# Patient Record
Sex: Male | Born: 1945 | Race: White | Hispanic: No | Marital: Single | Smoking: Current every day smoker
Health system: Southern US, Community
[De-identification: ages and names within clinical notes are randomized; demographics above are authoritative.]

## PROBLEM LIST (undated history)

## (undated) DIAGNOSIS — I509 Heart failure, unspecified: Secondary | ICD-10-CM

## (undated) DIAGNOSIS — J439 Emphysema, unspecified: Secondary | ICD-10-CM

## (undated) DIAGNOSIS — J45909 Unspecified asthma, uncomplicated: Secondary | ICD-10-CM

## (undated) DIAGNOSIS — I10 Essential (primary) hypertension: Secondary | ICD-10-CM

## (undated) DIAGNOSIS — Z720 Tobacco use: Secondary | ICD-10-CM

## (undated) DIAGNOSIS — J189 Pneumonia, unspecified organism: Secondary | ICD-10-CM

## (undated) DIAGNOSIS — F4310 Post-traumatic stress disorder, unspecified: Secondary | ICD-10-CM

## (undated) DIAGNOSIS — B9562 Methicillin resistant Staphylococcus aureus infection as the cause of diseases classified elsewhere: Secondary | ICD-10-CM

## (undated) DIAGNOSIS — L0390 Cellulitis, unspecified: Secondary | ICD-10-CM

## (undated) DIAGNOSIS — J449 Chronic obstructive pulmonary disease, unspecified: Secondary | ICD-10-CM

## (undated) HISTORY — PX: WISDOM TOOTH EXTRACTION: SHX21

## (undated) HISTORY — PX: HERNIA REPAIR: SHX51

---

## 2008-11-21 ENCOUNTER — Emergency Department (HOSPITAL_COMMUNITY): Admission: EM | Admit: 2008-11-21 | Discharge: 2008-11-21 | Payer: Self-pay | Admitting: Emergency Medicine

## 2008-12-18 ENCOUNTER — Emergency Department (HOSPITAL_COMMUNITY): Admission: EM | Admit: 2008-12-18 | Discharge: 2008-12-18 | Payer: Self-pay | Admitting: Emergency Medicine

## 2011-02-11 LAB — DIFFERENTIAL
Basophils Relative: 0 % (ref 0–1)
Lymphs Abs: 0.4 10*3/uL — ABNORMAL LOW (ref 0.7–4.0)
Monocytes Absolute: 0 10*3/uL — ABNORMAL LOW (ref 0.1–1.0)
Monocytes Relative: 0 % — ABNORMAL LOW (ref 3–12)
Neutro Abs: 14.1 10*3/uL — ABNORMAL HIGH (ref 1.7–7.7)

## 2011-02-11 LAB — POCT I-STAT, CHEM 8
Calcium, Ion: 1.12 mmol/L (ref 1.12–1.32)
Creatinine, Ser: 1.7 mg/dL — ABNORMAL HIGH (ref 0.4–1.5)
Glucose, Bld: 127 mg/dL — ABNORMAL HIGH (ref 70–99)
Hemoglobin: 19 g/dL — ABNORMAL HIGH (ref 13.0–17.0)
Sodium: 141 mEq/L (ref 135–145)
TCO2: 28 mmol/L (ref 0–100)

## 2011-02-11 LAB — POCT CARDIAC MARKERS: Troponin i, poc: <0.05 ng/mL (ref 0.00–0.09)

## 2011-02-11 LAB — HEPATIC FUNCTION PANEL
AST: 21 U/L (ref 0–37)
Alkaline Phosphatase: 84 U/L (ref 39–117)
Bilirubin, Direct: 0.3 mg/dL (ref 0.0–0.3)
Total Bilirubin: 1.5 mg/dL — ABNORMAL HIGH (ref 0.3–1.2)

## 2011-02-11 LAB — CBC
Hemoglobin: 18.3 g/dL — ABNORMAL HIGH (ref 13.0–17.0)
MCHC: 34.5 g/dL (ref 30.0–36.0)
MCV: 85.8 fL (ref 78.0–100.0)
RBC: 6.15 MIL/uL — ABNORMAL HIGH (ref 4.22–5.81)
WBC: 14.6 10*3/uL — ABNORMAL HIGH (ref 4.0–10.5)

## 2011-02-11 LAB — LACTIC ACID, PLASMA: Lactic Acid, Venous: 1.1 mmol/L (ref 0.5–2.2)

## 2011-02-12 LAB — CBC
HCT: 40.6 % (ref 39.0–52.0)
Hemoglobin: 13.9 g/dL (ref 13.0–17.0)
MCV: 87.3 fL (ref 78.0–100.0)
RBC: 4.65 MIL/uL (ref 4.22–5.81)
WBC: 10.4 10*3/uL (ref 4.0–10.5)

## 2011-02-12 LAB — BASIC METABOLIC PANEL
Chloride: 99 mEq/L (ref 96–112)
GFR calc Af Amer: >60 mL/min (ref >60)
GFR calc non Af Amer: >60 mL/min (ref >60)
Potassium: 3.6 mEq/L (ref 3.5–5.1)
Sodium: 136 mEq/L (ref 135–145)

## 2011-02-12 LAB — DIFFERENTIAL
Eosinophils Absolute: 0.1 10*3/uL (ref 0.0–0.7)
Eosinophils Relative: 1 % (ref 0–5)
Lymphocytes Relative: 13 % (ref 12–46)
Lymphs Abs: 1.4 10*3/uL (ref 0.7–4.0)
Monocytes Relative: 5 % (ref 3–12)

## 2012-12-01 ENCOUNTER — Encounter (HOSPITAL_COMMUNITY): Payer: Self-pay | Admitting: Emergency Medicine

## 2012-12-01 ENCOUNTER — Emergency Department (HOSPITAL_COMMUNITY): Payer: Medicare Other

## 2012-12-01 ENCOUNTER — Inpatient Hospital Stay (HOSPITAL_COMMUNITY)
Admission: EM | Admit: 2012-12-01 | Discharge: 2012-12-06 | DRG: 190 | Disposition: A | Discharge status: Home / Self Care | Payer: Medicare Other | Attending: Internal Medicine | Admitting: Internal Medicine

## 2012-12-01 DIAGNOSIS — Z79899 Other long term (current) drug therapy: Secondary | ICD-10-CM

## 2012-12-01 DIAGNOSIS — F4310 Post-traumatic stress disorder, unspecified: Secondary | ICD-10-CM

## 2012-12-01 DIAGNOSIS — R7303 Prediabetes: Secondary | ICD-10-CM

## 2012-12-01 DIAGNOSIS — J9601 Acute respiratory failure with hypoxia: Secondary | ICD-10-CM

## 2012-12-01 DIAGNOSIS — R000 Tachycardia, unspecified: Secondary | ICD-10-CM

## 2012-12-01 DIAGNOSIS — J101 Influenza due to other identified influenza virus with other respiratory manifestations: Secondary | ICD-10-CM | POA: Diagnosis present

## 2012-12-01 DIAGNOSIS — F17200 Nicotine dependence, unspecified, uncomplicated: Secondary | ICD-10-CM | POA: Diagnosis present

## 2012-12-01 DIAGNOSIS — J441 Chronic obstructive pulmonary disease with (acute) exacerbation: Principal | ICD-10-CM

## 2012-12-01 DIAGNOSIS — D696 Thrombocytopenia, unspecified: Secondary | ICD-10-CM

## 2012-12-01 DIAGNOSIS — J9600 Acute respiratory failure, unspecified whether with hypoxia or hypercapnia: Secondary | ICD-10-CM

## 2012-12-01 DIAGNOSIS — J449 Chronic obstructive pulmonary disease, unspecified: Secondary | ICD-10-CM | POA: Diagnosis present

## 2012-12-01 DIAGNOSIS — I498 Other specified cardiac arrhythmias: Secondary | ICD-10-CM | POA: Diagnosis present

## 2012-12-01 DIAGNOSIS — Z720 Tobacco use: Secondary | ICD-10-CM

## 2012-12-01 DIAGNOSIS — R7309 Other abnormal glucose: Secondary | ICD-10-CM

## 2012-12-01 DIAGNOSIS — J4489 Other specified chronic obstructive pulmonary disease: Secondary | ICD-10-CM

## 2012-12-01 HISTORY — DX: Unspecified asthma, uncomplicated: J45.909

## 2012-12-01 HISTORY — DX: Tobacco use: Z72.0

## 2012-12-01 HISTORY — DX: Chronic obstructive pulmonary disease, unspecified: J44.9

## 2012-12-01 HISTORY — DX: Emphysema, unspecified: J43.9

## 2012-12-01 HISTORY — DX: Post-traumatic stress disorder, unspecified: F43.10

## 2012-12-01 LAB — COMPREHENSIVE METABOLIC PANEL
ALT: 16 U/L (ref 0–53)
Alkaline Phosphatase: 70 U/L (ref 39–117)
BUN: 8 mg/dL (ref 6–23)
CO2: 27 mEq/L (ref 19–32)
Calcium: 8.6 mg/dL (ref 8.4–10.5)
GFR calc Af Amer: >90 mL/min (ref >90)
GFR calc non Af Amer: 86 mL/min — ABNORMAL LOW (ref >90)
Glucose, Bld: 108 mg/dL — ABNORMAL HIGH (ref 70–99)
Sodium: 136 mEq/L (ref 135–145)
Total Protein: 7.7 g/dL (ref 6.0–8.3)

## 2012-12-01 LAB — CBC WITH DIFFERENTIAL/PLATELET
Eosinophils Absolute: 0 10*3/uL (ref 0.0–0.7)
Eosinophils Relative: 0 % (ref 0–5)
HCT: 41.5 % (ref 39.0–52.0)
Hemoglobin: 13.9 g/dL (ref 13.0–17.0)
Lymphocytes Relative: 3 % — ABNORMAL LOW (ref 12–46)
Lymphs Abs: 0.3 10*3/uL — ABNORMAL LOW (ref 0.7–4.0)
MCH: 30.3 pg (ref 26.0–34.0)
MCV: 90.4 fL (ref 78.0–100.0)
Monocytes Absolute: 0.1 10*3/uL (ref 0.1–1.0)
Monocytes Relative: 1 % — ABNORMAL LOW (ref 3–12)
Platelets: 126 10*3/uL — ABNORMAL LOW (ref 150–400)
RBC: 4.59 MIL/uL (ref 4.22–5.81)
WBC: 10.4 10*3/uL (ref 4.0–10.5)

## 2012-12-01 LAB — BLOOD GAS, ARTERIAL
Acid-Base Excess: 2.6 mmol/L — ABNORMAL HIGH (ref 0.0–2.0)
Bicarbonate: 27.1 mEq/L — ABNORMAL HIGH (ref 20.0–24.0)
O2 Saturation: 97.3 %
Patient temperature: 100
pO2, Arterial: 86.9 mmHg (ref 80.0–100.0)

## 2012-12-01 LAB — PRO B NATRIURETIC PEPTIDE: Pro B Natriuretic peptide (BNP): 456.5 pg/mL — ABNORMAL HIGH (ref 0–125)

## 2012-12-01 LAB — GLUCOSE, CAPILLARY: Glucose-Capillary: 168 mg/dL — ABNORMAL HIGH (ref 70–99)

## 2012-12-01 MED ORDER — INSULIN ASPART 100 UNIT/ML ~~LOC~~ SOLN
0.0000 [IU] | Freq: Three times a day (TID) | SUBCUTANEOUS | Status: DC
  Administered 2012-12-02 (×2): 1 [IU] via SUBCUTANEOUS
  Administered 2012-12-02 – 2012-12-03 (×2): 2 [IU] via SUBCUTANEOUS
  Administered 2012-12-03 (×2): 1 [IU] via SUBCUTANEOUS
  Administered 2012-12-04: 3 [IU] via SUBCUTANEOUS
  Administered 2012-12-04: 1 [IU] via SUBCUTANEOUS
  Administered 2012-12-05: 2 [IU] via SUBCUTANEOUS

## 2012-12-01 MED ORDER — FLUTICASONE PROPIONATE 50 MCG/ACT NA SUSP
2.0000 | Freq: Every day | NASAL | Status: DC
  Administered 2012-12-02 – 2012-12-06 (×5): 2 via NASAL
  Filled 2012-12-01: qty 16

## 2012-12-01 MED ORDER — ACETAMINOPHEN 325 MG PO TABS: 650.0000 mg | ORAL_TABLET | Freq: Four times a day (QID) | ORAL | Status: DC | PRN

## 2012-12-01 MED ORDER — BIOTENE DRY MOUTH MT LIQD
15.0000 mL | Freq: Two times a day (BID) | OROMUCOSAL | Status: DC
  Administered 2012-12-02 – 2012-12-05 (×6): 15 mL via OROMUCOSAL

## 2012-12-01 MED ORDER — OXYMETAZOLINE HCL 0.05 % NA SOLN
1.0000 | Freq: Two times a day (BID) | NASAL | Status: AC
  Administered 2012-12-02 – 2012-12-03 (×2): 1 via NASAL
  Filled 2012-12-01: qty 15

## 2012-12-01 MED ORDER — DOCUSATE SODIUM 100 MG PO CAPS
100.0000 mg | ORAL_CAPSULE | Freq: Two times a day (BID) | ORAL | Status: DC
  Administered 2012-12-02 – 2012-12-06 (×7): 100 mg via ORAL
  Filled 2012-12-01 (×11): qty 1

## 2012-12-01 MED ORDER — SENNA 8.6 MG PO TABS
1.0000 | ORAL_TABLET | Freq: Two times a day (BID) | ORAL | Status: DC
  Administered 2012-12-02 – 2012-12-04 (×4): 8.6 mg via ORAL
  Filled 2012-12-01 (×7): qty 1

## 2012-12-01 MED ORDER — BUDESONIDE 0.5 MG/2ML IN SUSP
0.5000 mg | Freq: Two times a day (BID) | RESPIRATORY_TRACT | Status: DC
  Administered 2012-12-01 – 2012-12-05 (×9): 0.5 mg via RESPIRATORY_TRACT
  Filled 2012-12-01 (×13): qty 2

## 2012-12-01 MED ORDER — METHYLPREDNISOLONE SODIUM SUCC 125 MG IJ SOLR
60.0000 mg | Freq: Two times a day (BID) | INTRAMUSCULAR | Status: DC
  Administered 2012-12-01 – 2012-12-04 (×6): 60 mg via INTRAVENOUS
  Filled 2012-12-01 (×8): qty 0.96

## 2012-12-01 MED ORDER — CEPASTAT 14.5 MG MT LOZG
1.0000 | LOZENGE | OROMUCOSAL | Status: DC | PRN
  Administered 2012-12-01: 1 via BUCCAL
  Filled 2012-12-01 (×4): qty 18

## 2012-12-01 MED ORDER — POLYETHYLENE GLYCOL 3350 17 G PO PACK
17.0000 g | PACK | Freq: Every day | ORAL | Status: DC | PRN
  Filled 2012-12-01: qty 1

## 2012-12-01 MED ORDER — ZOLPIDEM TARTRATE 10 MG PO TABS
10.0000 mg | ORAL_TABLET | Freq: Every day | ORAL | Status: DC
  Administered 2012-12-01 – 2012-12-05 (×5): 10 mg via ORAL
  Filled 2012-12-01 (×5): qty 1

## 2012-12-01 MED ORDER — BIOTENE DRY MOUTH MT LIQD: 15.0000 mL | OROMUCOSAL | Status: DC | PRN

## 2012-12-01 MED ORDER — SODIUM CHLORIDE 0.9 % IJ SOLN
3.0000 mL | Freq: Two times a day (BID) | INTRAMUSCULAR | Status: DC
  Administered 2012-12-01 – 2012-12-06 (×4): 3 mL via INTRAVENOUS

## 2012-12-01 MED ORDER — ACETAMINOPHEN 650 MG RE SUPP: 650.0000 mg | Freq: Four times a day (QID) | RECTAL | Status: DC | PRN

## 2012-12-01 MED ORDER — IPRATROPIUM BROMIDE 0.02 % IN SOLN
0.5000 mg | RESPIRATORY_TRACT | Status: DC
  Administered 2012-12-01 – 2012-12-05 (×23): 0.5 mg via RESPIRATORY_TRACT
  Filled 2012-12-01 (×24): qty 2.5

## 2012-12-01 MED ORDER — LEVOFLOXACIN IN D5W 750 MG/150ML IV SOLN
750.0000 mg | Freq: Once | INTRAVENOUS | Status: AC
  Administered 2012-12-01: 750 mg via INTRAVENOUS
  Filled 2012-12-01: qty 150

## 2012-12-01 MED ORDER — BENZONATATE 100 MG PO CAPS
200.0000 mg | ORAL_CAPSULE | Freq: Three times a day (TID) | ORAL | Status: DC
Start: 2012-12-01 — End: 2012-12-06
  Administered 2012-12-01 – 2012-12-06 (×14): 200 mg via ORAL
  Filled 2012-12-01 (×16): qty 2

## 2012-12-01 MED ORDER — ALBUTEROL (5 MG/ML) CONTINUOUS INHALATION SOLN
10.0000 mg/h | INHALATION_SOLUTION | Freq: Once | RESPIRATORY_TRACT | Status: AC
  Administered 2012-12-01: 10 mg/h via RESPIRATORY_TRACT
  Filled 2012-12-01: qty 20

## 2012-12-01 MED ORDER — ALBUTEROL SULFATE (5 MG/ML) 0.5% IN NEBU
2.5000 mg | INHALATION_SOLUTION | RESPIRATORY_TRACT | Status: DC
  Administered 2012-12-01 – 2012-12-05 (×23): 2.5 mg via RESPIRATORY_TRACT
  Filled 2012-12-01 (×25): qty 0.5

## 2012-12-01 MED ORDER — SODIUM CHLORIDE 0.9 % IV SOLN
INTRAVENOUS | Status: DC
  Administered 2012-12-01: 75 mL/h via INTRAVENOUS

## 2012-12-01 MED ORDER — PANTOPRAZOLE SODIUM 40 MG PO TBEC
40.0000 mg | DELAYED_RELEASE_TABLET | Freq: Two times a day (BID) | ORAL | Status: DC
  Administered 2012-12-02 – 2012-12-06 (×9): 40 mg via ORAL
  Filled 2012-12-01 (×11): qty 1

## 2012-12-01 MED ORDER — DOXEPIN HCL 50 MG PO CAPS
50.0000 mg | ORAL_CAPSULE | Freq: Every day | ORAL | Status: DC
  Administered 2012-12-01 – 2012-12-05 (×5): 50 mg via ORAL
  Filled 2012-12-01 (×6): qty 1

## 2012-12-01 MED ORDER — ONDANSETRON HCL 4 MG/2ML IJ SOLN: 4.0000 mg | Freq: Four times a day (QID) | INTRAMUSCULAR | Status: DC | PRN

## 2012-12-01 MED ORDER — GUAIFENESIN-DM 100-10 MG/5ML PO SYRP: 5.0000 mL | ORAL_SOLUTION | ORAL | Status: DC | PRN

## 2012-12-01 MED ORDER — ENOXAPARIN SODIUM 40 MG/0.4ML ~~LOC~~ SOLN
40.0000 mg | SUBCUTANEOUS | Status: DC
  Administered 2012-12-01 – 2012-12-05 (×5): 40 mg via SUBCUTANEOUS
  Filled 2012-12-01 (×6): qty 0.4

## 2012-12-01 MED ORDER — SALINE SPRAY 0.65 % NA SOLN
1.0000 | NASAL | Status: DC | PRN
  Administered 2012-12-01 – 2012-12-02 (×2): 1 via NASAL
  Filled 2012-12-01: qty 44

## 2012-12-01 MED ORDER — LEVOFLOXACIN IN D5W 750 MG/150ML IV SOLN
750.0000 mg | INTRAVENOUS | Status: DC
  Administered 2012-12-02 – 2012-12-03 (×2): 750 mg via INTRAVENOUS
  Filled 2012-12-01 (×3): qty 150

## 2012-12-01 MED ORDER — ALBUTEROL SULFATE (5 MG/ML) 0.5% IN NEBU
2.5000 mg | INHALATION_SOLUTION | RESPIRATORY_TRACT | Status: DC | PRN
  Administered 2012-12-05: 2.5 mg via RESPIRATORY_TRACT
  Filled 2012-12-01: qty 0.5

## 2012-12-01 MED ORDER — CHLORHEXIDINE GLUCONATE 0.12 % MT SOLN
15.0000 mL | Freq: Two times a day (BID) | OROMUCOSAL | Status: DC
  Administered 2012-12-02 – 2012-12-06 (×8): 15 mL via OROMUCOSAL
  Filled 2012-12-01 (×11): qty 15

## 2012-12-01 MED ORDER — SERTRALINE HCL 100 MG PO TABS
100.0000 mg | ORAL_TABLET | Freq: Every day | ORAL | Status: DC
  Administered 2012-12-01 – 2012-12-06 (×6): 100 mg via ORAL
  Filled 2012-12-01 (×6): qty 1

## 2012-12-01 MED ORDER — IBUPROFEN 200 MG PO TABS
600.0000 mg | ORAL_TABLET | Freq: Once | ORAL | Status: AC
  Administered 2012-12-01: 600 mg via ORAL
  Filled 2012-12-01: qty 3

## 2012-12-01 MED ORDER — ONDANSETRON HCL 4 MG PO TABS: 4.0000 mg | ORAL_TABLET | Freq: Four times a day (QID) | ORAL | Status: DC | PRN

## 2012-12-01 NOTE — ED Notes (Signed)
Pt called his nephew to see if he could come get pt's wallet with $1000 in it, so it doesn't have to be inventoried and locked up with security.

## 2012-12-01 NOTE — ED Notes (Signed)
Per EMS pt c/o shob and resp distress times 2-3 days even after home neb treatments..  10mg  albuterol and 0.5mg  atrovent, 125mg  solu-medrol in route by EMS.

## 2012-12-01 NOTE — ED Notes (Signed)
MD hospitalist at bedside.

## 2012-12-01 NOTE — Progress Notes (Signed)
ANTIBIOTIC CONSULT NOTE - INITIAL  Pharmacy Consult for Levaquin  Indication: COPD exacerbation   No Known Allergies  Patient Measurements:   Vital Signs: Temp: 99.1 F (37.3 C) (02/04 2015) Temp src: Oral (02/04 2015) BP: 131/78 mmHg (02/04 2020) Pulse Rate: 96  (02/04 2020) Intake/Output from previous day:   Intake/Output from this shift:    Labs:  Basename 12/01/12 1630 12/01/12 1550  WBC 10.4 --  HGB 13.9 --  PLT 126* --  LABCREA -- --  CREATININE -- 0.91   CrCl is unknown because there is no height on file for the current visit. No results found for this basename: VANCOTROUGH:2,VANCOPEAK:2,VANCORANDOM:2,GENTTROUGH:2,GENTPEAK:2,GENTRANDOM:2,TOBRATROUGH:2,TOBRAPEAK:2,TOBRARND:2,AMIKACINPEAK:2,AMIKACINTROU:2,AMIKACIN:2, in the last 72 hours   Microbiology: No results found for this or any previous visit (from the past 720 hour(s)).  Medical History: Past Medical History  Diagnosis Date  . COPD (chronic obstructive pulmonary disease)   . Asthma   . Emphysema   . PTSD (post-traumatic stress disorder)   . Tobacco abuse     Medications:  Scheduled:    . ipratropium  0.5 mg Nebulization Q4H   And  . albuterol  2.5 mg Nebulization Q4H  . [COMPLETED] albuterol  10 mg/hr Nebulization Once  . benzonatate  200 mg Oral TID  . budesonide (PULMICORT) nebulizer solution  0.5 mg Nebulization BID  . docusate sodium  100 mg Oral BID  . doxepin  50 mg Oral QHS  . enoxaparin (LOVENOX) injection  40 mg Subcutaneous Q24H  . fluticasone  2 spray Each Nare Daily  . [COMPLETED] ibuprofen  600 mg Oral Once  . insulin aspart  0-9 Units Subcutaneous TID WC  . methylPREDNISolone (SOLU-MEDROL) injection  60 mg Intravenous Q12H  . oxymetazoline  1 spray Each Nare BID  . pantoprazole  40 mg Oral BID AC  . senna  1 tablet Oral BID  . sertraline  100 mg Oral Daily  . sodium chloride  3 mL Intravenous Q12H  . zolpidem  10 mg Oral QHS   Infusions:    . sodium chloride    .  [COMPLETED] levofloxacin (LEVAQUIN) IV Stopped (12/01/12 1808)  . levofloxacin (LEVAQUIN) IV     PRN: acetaminophen, acetaminophen, albuterol, antiseptic oral rinse, guaiFENesin-dextromethorphan, ondansetron (ZOFRAN) IV, ondansetron, phenol-menthol, polyethylene glycol, sodium chloride Anti-infectives     Start     Dose/Rate Route Frequency Ordered Stop   12/02/12 1600   levofloxacin (LEVAQUIN) IVPB 750 mg        750 mg 100 mL/hr over 90 Minutes Intravenous Every 24 hours 12/01/12 2052     12/01/12 1615   levofloxacin (LEVAQUIN) IVPB 750 mg        750 mg 100 mL/hr over 90 Minutes Intravenous  Once 12/01/12 1603 12/01/12 1808         Assessment:  67 yo M starting Levaquin for COPD exacerbation  Scr WNL  Will f/u with updated ht/wt when available   Goal of Therapy:  Levaquin per renal function   Plan:  1.) Levaquin 750 mg IV q24h, next dose due 2/5 as dose was given in ER today.  2.) f/u Ht/Wt/Renal function   Georg Ang, Loma Messing 12/01/2012,8:53 PM

## 2012-12-01 NOTE — ED Notes (Signed)
Inquired to pt ab money in his wallet requesting to let us lock it up for him. Explained our policy to pt. Pt refused having money locked up. Pt admits to having less than $1000 in wallet to "about $500".

## 2012-12-01 NOTE — H&P (Addendum)
[facility] History and Physical  Simone Tuckey XBJ:[id number] DOB: October 29, 1945 DOA: 12/01/2012  Referring physician: Dr. Ranae Palms PCP: No primary provider on file.  Dr. Manfred Arch at the [facility]  Chief Complaint:  SOB  HPI:  The patient is a 67 yo male with history of COPD and PTSD who presents with SOB.   A month ago, he was able to walk a [place] distance to his horse barn, a week ago, he could barely walk around the house, and for the last two days he has been SOB at rest.  He called EMS today because he could barely breath.  He has had a dry wheezy cough for the last two days and some subjective fevers.  Denies rhinorrhea, sinus congestion.  He has a dry throat.  Denies hemoptysis.  In transport, he was given solumedrol 125mg , albuterol 10mg  and atrovent 5mg .  He normally has the ambo take him to the [place], however, today, he had them take him to the nearest ER because "I thought I was going to die."  In the ER, his labs were notable for platelets of 126 and CXR was c/w emphysema.  He was started on bipap for tachypnea and moderate to severe respiratory distress which remained on for about an hour.  He was transitioned to 2L.  ABG on 2L [place] was pH 7.4, pCO2 45, pO2 87.  He was given albuterol 10mg  and levofloxacin IV.    Review of Systems:   Endorses subjective fevers without chills.  Denies changes to hearing and vision.  Denies rhinorrhea, sinus congestion.  Has very dry throat but denies sore throat.  Chest tightness is better after albuterol treatments.  SOB, wheezing, and cough as per HPI.  Denies nausea, vomiting, diarrhea, constipation, blood in stools, dysuria, difficulty urinating, hematuria, polyuria, polydipsia, lymphadenopathy.  He has skin ulcers on his arms.  Denies abnormal bruising or bleeding, focal weakness or numbness.  He has PTSD.    Past Medical History  Diagnosis Date  . COPD (chronic obstructive pulmonary disease)   . Asthma   . Emphysema   . PTSD (post-traumatic  stress disorder)   . Tobacco abuse    Past Surgical History  Procedure Date  . Hernia repair    Social History:  reports that he has been smoking Cigarettes.  He has a 110 pack-year smoking history. He has never used smokeless tobacco. He reports that he drinks alcohol. He reports that he does not use illicit drugs. [place]  No Known Allergies  Family History  Problem Relation Age of Onset  . Asthma Cousin   . Asthma Cousin   . Diabetes Brother   . Diabetes Mother     Prior to Admission medications   Medication Sig Start Date End Date Taking? Authorizing Provider  albuterol (PROVENTIL HFA;VENTOLIN HFA) 108 (90 BASE) MCG/ACT inhaler Inhale 2 puffs into the lungs every 6 (six) hours as needed.   Yes Historical Provider, MD  budesonide-formoterol (SYMBICORT) 160-4.5 MCG/ACT inhaler Inhale 2 puffs into the lungs 2 (two) times daily.   Yes Historical Provider, MD  doxepin (SINEQUAN) 50 MG capsule Take 50 mg by mouth at bedtime.   Yes Historical Provider, MD  sertraline (ZOLOFT) 100 MG tablet Take 100 mg by mouth daily.   Yes Historical Provider, MD  zolpidem (AMBIEN) 10 MG tablet Take 10 mg by mouth at bedtime.   Yes Historical Provider, MD   Physical Exam: Filed Vitals:   12/01/12 1704 12/01/12 1800  12/01/12 1830 12/01/12 1845  BP:  130/75    Pulse:   105 103  Resp:  22 13 18   SpO2: 96%  95% 95%     General:  Obese caucasian male, moderate respiratory distress with SCM, supraclavicular, suprasternal and subcostal retractions, prolonged and forced expiratory phase and pursed lip breathing  Eyes: PERRL, mild injection, anicteric  ENT: nares clear, OP nonerythematous, no exudate or plaques, mildly dry MM  Neck: No thyromegaly or nodules, supple  Lymph:  No cervical or supraclavicular LAD  Cardiovascular: Tachycardic, regular rhythm, no murmurs, rubs, or gallops, 2+ pulses, warm extremities  Respiratory:  Faint rales at the bilateral bases R>L, diminished  at bilateral bases, full high pitched expiratory wheeze throughout, no rhonchi.  Frequent dry wheezy cough.   Abdomen: NABS, soft, mildly distended with mild diffuse tenderness without rebound or guarding   Skin: several ulcers on the forearms with rolled edges  Musculoskeletal: Normal tone and bulk, trace LEE   Psychiatric: A&OX4, normal affect  Neurologic: III-XII grossly intact, strength 5/5 throughout, sensation intact to light touch.  No dysmetria  Labs on Admission:  Basic Metabolic Panel:  Lab 12/01/12 1610  NA 136  K 4.0  CL 96  CO2 27  GLUCOSE 108*  BUN 8  CREATININE 0.91  CALCIUM 8.6  MG --  PHOS --   Liver Function Tests:  Lab 12/01/12 1550  AST 28  ALT 16  ALKPHOS 70  BILITOT 0.8  PROT 7.7  ALBUMIN 3.7   No results found for this basename: LIPASE:5,AMYLASE:5 in the last 168 hours No results found for this basename: AMMONIA:5 in the last 168 hours CBC:  Lab 12/01/12 1630  WBC 10.4  NEUTROABS 9.9*  HGB 13.9  HCT 41.5  MCV 90.4  PLT 126*   Cardiac Enzymes:  Lab 12/01/12 1550  CKTOTAL --  CKMB --  CKMBINDEX --  TROPONINI <0.30    BNP (last 3 results)  Basename 12/01/12 1550  PROBNP 456.5*   CBG: No results found for this basename: GLUCAP:5 in the last 168 hours  Radiological Exams on Admission: Dg Chest Port 1 View  12/01/2012  *RADIOLOGY REPORT*  Clinical Data: Shortness of breath.  COPD.  PORTABLE CHEST - 1 VIEW  Comparison: PA and lateral chest 12/18/2008.  Findings: The chest is hyperexpanded but the lungs are clear. Heart size is normal.  No pneumothorax or pleural fluid. Surgical clips at the gastroesophageal junction are again seen.  IMPRESSION: Hyperexpansion compatible with emphysema.  No acute abnormality.   Original Report Authenticated By: Holley Dexter, M.D.     EKG: Independently reviewed. Sinus tachycardia  Assessment/Plan Active Problems:  COPD (chronic obstructive pulmonary disease)  PTSD (post-traumatic stress  disorder)  Acute respiratory failure with hypoxia  Tobacco abuse  Thrombocytopenia  Sinus tachycardia   Acute hypoxic respiratory failure due to acute COPD exacerbation.  Trigger may have been a viral illness, including flu, particularly given patient's recent subjective fevers.  Tachypneic to the high 20s and low 30s during exam with mod. resp distress. -  Admit to stepdown -  Flu swab -  Nasal canula to keep O2 sat > 88 % -  Solumedrol 80mg  bid  -  Budesonide -  Duonebs q4h -  Albuterol q2h prn -  Levofloxacin -  Tessalon  -  Mucinex -  Nasal saline, flonase, afrin -  Protonix bid -  Hold advair and symbicort  Borderline diabetes and Hyperglycemia risk while on steroids -  SSI -  A1c  Sinus tachycardia, likely due to albuterol and respiratory distress, but patient also appears dry.  Improved after albuterol and bipap.   -  IVF -  Continue breathing treatments as abov  Thrombocytopenia, mild and asymptomatic.  Unclear etiology as this time. -  Trend platelets  Tobacco abuse:  Encouraged cessation.  Patient states his PCP gave him an Rx for chantix last week.   -  Declined gum and patch for now  PTSD:   -  Patient's family member to bring in medications to verify as he is unsure of doses.    DIET:  Regular ACCESS:  PIV IVF:  NS at 32ml/h Proph:  lovenox  Code Status: full code Family Communication: spoke with patient alone Disposition Plan: admit to stepdown  Time spent: 34  Renae Fickle [facility] Pager [phone]  If 7PM-7AM, please contact night-coverage [email or web address] Password [facility] 12/01/2012, 7:19 PM

## 2012-12-01 NOTE — ED Provider Notes (Signed)
History  This chart was scribed for [name] Racer, MD by Shari Heritage, ED Scribe. The patient was seen in room RESB/RESB. Patient's care was started at 1532.   CSN: [id number]  Arrival date & time 12/01/12  1517   First MD Initiated Contact with Patient 12/01/12 1532      Chief Complaint  Patient presents with  . Respiratory Distress    Patient is a 67 y.o. male presenting with shortness of breath. The history is provided by the patient. No language interpreter was used.  Shortness of Breath  The current episode started 2 days ago. The onset was gradual. The problem occurs continuously. The problem has been unchanged. The problem is severe. Associated symptoms include a fever, cough, shortness of breath and wheezing. His past medical history is significant for asthma and past wheezing.     HPI Comments: [name] [name] is a 67 y.o. male with history of COPD and asthma who presents to the Emergency Department complaining of increased, persistent, moderate to severe shortness of breath and wheezing onset 2 days ago. Patient reports associated symptoms of productive cough, fever, chills,  and headache. Patient states that headache began today when he arrived in the ED. He is not on oxygen at home. He was brought in today by EMS. He had 10 mg Albuterol, 0.5 mg Atrovent and 125 mg Solu-Medrol en route.   Past Medical History  Diagnosis Date  . COPD (chronic obstructive pulmonary disease)   . Asthma   . Emphysema     Past Surgical History  Procedure Date  . Hernia repair     History reviewed. No pertinent family history.  History  Substance Use Topics  . Smoking status: Current Every Day Smoker -- 2.0 packs/day for 55 years    Types: Cigarettes  . Smokeless tobacco: Never Used  . Alcohol Use: Yes     Comment: occasion      Review of Systems  Constitutional: Positive for fever and chills.  Respiratory: Positive for cough, shortness of breath and wheezing.   All other  systems reviewed and are negative.    Allergies  Review of patient's allergies indicates no known allergies.  Home Medications   Current Outpatient Rx  Name  Route  Sig  Dispense  Refill  . ALBUTEROL SULFATE HFA 108 (90 BASE) MCG/ACT IN AERS   Inhalation   Inhale 2 puffs into the lungs every 6 (six) hours as needed.         . BUDESONIDE-FORMOTEROL FUMARATE 160-4.5 MCG/ACT IN AERO   Inhalation   Inhale 2 puffs into the lungs 2 (two) times daily.         Marland Kitchen DOXEPIN HCL 50 MG PO CAPS   Oral   Take 50 mg by mouth at bedtime.         . SERTRALINE HCL 100 MG PO TABS   Oral   Take 100 mg by mouth daily.         Marland Kitchen ZOLPIDEM TARTRATE 10 MG PO TABS   Oral   Take 10 mg by mouth at bedtime.           BP 139/78  Pulse 120  Resp 25  SpO2 96%  Physical Exam  Constitutional: He is oriented to person, place, and time. He appears well-developed and well-nourished.       Following commands.  HENT:  Head: Normocephalic and atraumatic.  Mouth/Throat: Oropharynx is clear and moist.  Eyes: Conjunctivae normal and EOM are normal. Pupils are equal,  round, and reactive to light.  Neck: Normal range of motion. Neck supple.  Cardiovascular: Regular rhythm and normal heart sounds.  Tachycardia present.   Pulmonary/Chest: Tachypnea noted. He has wheezes.       Speaking in several word sentences. Increased work of breathing. Expiratory wheezes throughout.   Abdominal: Soft. Bowel sounds are normal. He exhibits no distension. There is no tenderness. There is no rebound.  Musculoskeletal: Normal range of motion. He exhibits no edema.       No lower extremity swelling.  Neurological: He is alert and oriented to person, place, and time.  Skin: Skin is warm and dry.  Psychiatric: He has a normal mood and affect. His behavior is normal.    ED Course  Procedures (including critical care time) DIAGNOSTIC STUDIES: Oxygen Saturation is 94% on room air, adequate by my interpretation.     COORDINATION OF CARE: 3:37 PM- Have ordered BiPAP. Recommending admission to manage patient's COPD exacerbation. Patient informed of current plan for treatment and evaluation and agrees with plan at this time.   5:19 PM- Appears more comfortable. Still has inspiratory and expiratory wheezing. Will page internal medicine regarding admission.  5:54 PM- Patient is still wheezing, but is moderately improved. Speaking in full sentences. Currently on neb. Dr. Malachi Bonds of [facility] is here to evaluate patient.   Labs Reviewed  COMPREHENSIVE METABOLIC PANEL - Abnormal; Notable for the following:    Glucose, Bld 108 (*)     GFR calc non Af Amer 86 (*)     All other components within normal limits  PRO B NATRIURETIC PEPTIDE - Abnormal; Notable for the following:    Pro B Natriuretic peptide (BNP) 456.5 (*)     All other components within normal limits  BLOOD GAS, ARTERIAL - Abnormal; Notable for the following:    Bicarbonate 27.1 (*)     Acid-Base Excess 2.6 (*)     All other components within normal limits  CBC WITH DIFFERENTIAL - Abnormal; Notable for the following:    Platelets 126 (*)     Neutrophils Relative 96 (*)     Neutro Abs 9.9 (*)     Lymphocytes Relative 3 (*)     Lymphs Abs 0.3 (*)     Monocytes Relative 1 (*)     All other components within normal limits  TROPONIN I  CBC WITH DIFFERENTIAL    Dg Chest Port 1 View  12/01/2012  *RADIOLOGY REPORT*  Clinical Data: Shortness of breath.  COPD.  PORTABLE CHEST - 1 VIEW  Comparison: PA and lateral chest 12/18/2008.  Findings: The chest is hyperexpanded but the lungs are clear. Heart size is normal.  No pneumothorax or pleural fluid. Surgical clips at the gastroesophageal junction are again seen.  IMPRESSION: Hyperexpansion compatible with emphysema.  No acute abnormality.   Original Report Authenticated By: Holley Dexter, M.D.      1. COPD exacerbation       Date: 12/01/2012  Rate: 126  Rhythm: sinus tachycardia   QRS Axis: normal  Intervals: normal  ST/T Wave abnormalities: ST depressions inferiorly and ST depressions laterally  Conduction Disutrbances:none  Narrative Interpretation:   Old EKG Reviewed: none available  CRITICAL CARE Performed by: Ranae Palms, Lesette Frary   Total critical care time: 20  Critical care time was exclusive of separately billable procedures and treating other patients.  Critical care was necessary to treat or prevent imminent or life-threatening deterioration.  Critical care was time spent personally by me on the following activities:  development of treatment plan with patient and/or surrogate as well as nursing, discussions with consultants, evaluation of patient's response to treatment, examination of patient, obtaining history from patient or surrogate, ordering and performing treatments and interventions, ordering and review of laboratory studies, ordering and review of radiographic studies, pulse oximetry and re-evaluation of patient's condition.  MDM  I personally performed the services described in this documentation, which was scribed in my presence. The recorded information has been reviewed and is accurate.    [name] Racer, MD 12/01/12 1800

## 2012-12-02 LAB — BASIC METABOLIC PANEL
CO2: 27 mEq/L (ref 19–32)
Chloride: 97 mEq/L (ref 96–112)
Glucose, Bld: 142 mg/dL — ABNORMAL HIGH (ref 70–99)
Sodium: 135 mEq/L (ref 135–145)

## 2012-12-02 LAB — INFLUENZA PANEL BY PCR (TYPE A & B): H1N1 flu by pcr: DETECTED — AB

## 2012-12-02 LAB — GLUCOSE, CAPILLARY: Glucose-Capillary: 138 mg/dL — ABNORMAL HIGH (ref 70–99)

## 2012-12-02 LAB — CBC
HCT: 39.1 % (ref 39.0–52.0)
MCH: 30.4 pg (ref 26.0–34.0)
MCV: 89.3 fL (ref 78.0–100.0)
RBC: 4.38 MIL/uL (ref 4.22–5.81)
WBC: 6.3 10*3/uL (ref 4.0–10.5)

## 2012-12-02 MED ORDER — OSELTAMIVIR PHOSPHATE 75 MG PO CAPS
75.0000 mg | ORAL_CAPSULE | Freq: Two times a day (BID) | ORAL | Status: DC
  Administered 2012-12-02 – 2012-12-06 (×9): 75 mg via ORAL
  Filled 2012-12-02 (×10): qty 1

## 2012-12-02 NOTE — Evaluation (Signed)
Physical Therapy Evaluation Patient Details Name: [name] [name] MRN: [id number] DOB: 02-25-1946 Today's Date: 12/02/2012 Time: 0941-1000 PT Time Calculation (min): 19 min  PT Assessment / Plan / Recommendation Clinical Impression  Pt. is 67 yo male admitted with respiratory distress. Pt tolerated mobilizing and ambulation x 60 ft. Pt will benefit from PT while in acute care.HR 121 during mobility. sats low 90's.    PT Assessment  Patient needs continued PT services    Follow Up Recommendations  Home health PT (may not be necessary)    Does the patient have the potential to tolerate intense rehabilitation      Barriers to Discharge        Equipment Recommendations  None recommended by PT    Recommendations for Other Services     Frequency Min 3X/week    Precautions / Restrictions Precautions Precaution Comments: dyspnea, impulsice Restrictions Weight Bearing Restrictions: No   Pertinent Vitals/Pain Max HR121, sats RA low 90 whil walking      Mobility  Bed Mobility Bed Mobility: Supine to Sit Supine to Sit: 5: Supervision;HOB elevated;With rails Transfers Transfers: Sit to Stand;Stand to Sit Sit to Stand: 4: Min assist;From bed;With upper extremity assist Stand to Sit: With armrests;To chair/3-in-1;With upper extremity assist;4: Min guard Details for Transfer Assistance: cues for safe transtions, use of UE's Ambulation/Gait Ambulation/Gait Assistance: 4: Min assist Ambulation Distance (Feet): 80 Feet Assistive device:  (iv pole) Ambulation/Gait Assistance Details: halting at times with dyspnea. Gait Pattern: Step-through pattern Gait velocity: decreased    Exercises     PT Diagnosis: Difficulty walking  PT Problem List: Decreased activity tolerance;Decreased mobility;Cardiopulmonary status limiting activity;Decreased knowledge of use of DME;Decreased safety awareness PT Treatment Interventions: DME instruction;Gait training;Functional mobility  training;Therapeutic activities;Therapeutic exercise;Patient/family education   PT Goals Acute Rehab PT Goals PT Goal Formulation: With patient Time For Goal Achievement: 12/16/12 Potential to Achieve Goals: Good Pt will go Supine/Side to Sit: Independently PT Goal: Supine/Side to Sit - Progress: Goal set today Pt will go Sit to Supine/Side: Independently PT Goal: Sit to Supine/Side - Progress: Goal set today Pt will go Sit to Stand: with modified independence PT Goal: Sit to Stand - Progress: Goal set today Pt will go Stand to Sit: with modified independence PT Goal: Stand to Sit - Progress: Goal set today Pt will Ambulate: >150 feet;with least restrictive assistive device PT Goal: Ambulate - Progress: Goal set today  Visit Information  Last PT Received On: 12/02/12 Assistance Needed: +1 PT/OT Co-Evaluation/Treatment: Yes    Subjective Data  Subjective: I ahve never called 911 for my self. Patient Stated Goal: to return home   Prior Functioning  Home Living Lives With: Alone Available Help at Discharge: Family;Friend(s);Available PRN/intermittently Type of Home: House Home Access: Stairs to enter [employer] of Steps: 3 Entrance Stairs-Rails: Right;Left;Can reach both Home Layout: One level Bathroom Shower/Tub: Health visitor: Handicapped height Home Adaptive Equipment: Shower chair with back;Grab bars around toilet;Grab bars in shower;Walker - rolling;Straight cane Prior Function Level of Independence: Independent Able to Take Stairs?: Yes Driving: Yes Vocation: Retired Musician: No difficulties Dominant Hand: Right    Cognition  Cognition Overall Cognitive Status: Appears within functional limits for tasks assessed/performed Arousal/Alertness: Awake/alert Orientation Level: Appears intact for tasks assessed Behavior During Session: [facility] for tasks performed Cognition - Other Comments: impulsive    Extremity/Trunk  Assessment Right Lower Extremity Assessment RLE ROM/Strength/Tone: [facility] for tasks assessed;Deficits RLE ROM/Strength/Tone Deficits: shakey,jerky RLE Sensation: WFL - Light Touch Left Lower Extremity Assessment  LLE ROM/Strength/Tone: [facility] for tasks assessed;Deficits LLE ROM/Strength/Tone Deficits: same as R LLE Sensation: WFL - Light Touch   Balance Balance Balance Assessed: Yes Static Sitting Balance Static Sitting - Balance Support: No upper extremity supported;Feet supported Static Sitting - Level of Assistance: 7: Independent  End of Session PT - End of Session Activity Tolerance: Patient limited by fatigue;Treatment limited secondary to medical complications (Comment) Patient left: in chair;with call bell/phone within reach Nurse Communication: Mobility status  GP Functional Assessment Tool Used: clinical judgement. Functional Limitation: Mobility: Walking and moving around Mobility: Walking and Moving Around Current Status [phone]): At least 1 percent but less than 20 percent impaired, limited or restricted Mobility: Walking and Moving Around Goal Status [phone]): 0 percent impaired, limited or restricted   Rada Hay 12/02/2012, 10:53 AM  [phone]

## 2012-12-02 NOTE — Progress Notes (Addendum)
Nutrition Brief Note  Patient identified on the Malnutrition Screening Tool (MST) Report  Body mass index is 25.74 kg/(m^2). Pt is overweight.  Pt reports that he usually weighs about 160 lbs; denies wt loss. Pt states his appetite is good and was good PTA with intake of 3 meals daily. Pt has no diet related questions or concerns.  Current diet order is regular, patient is consuming approximately 100% of meals at this time. Labs and medications reviewed.   No nutrition interventions warranted at this time. If nutrition issues arise, please consult RD.   Ian Malkin RD, LDN Inpatient Clinical Dietitian Pager: [phone] After Hours Pager: [phone]

## 2012-12-02 NOTE — Clinical Social Work Note (Signed)
CSW received consult to evaluate for COPD Gold. Pt has NOT had three or more admissions in last 6 months to qualify him for this protocol. CSW signing off. Please re-consult, if other needs arise.   Doreen Salvage, LCSW ICU/Stepdown Clinical Social Worker [facility] [phone] Hours 8am-1200pm M-F

## 2012-12-02 NOTE — Progress Notes (Signed)
Patient ID: [name] [name], male   DOB: 08-06-1946, 67 y.o.   MRN: [id number]  [facility] PROGRESS NOTE  Allex Lapoint WUJ:[id number] DOB: 1946-01-29 DOA: 12/01/2012 PCP: No primary provider on file.  Brief narrative: The patient is a 67 yo male with history of COPD and PTSD who presented to [facility] ED with main concern of progressively worsening shortness of breath that initially started one month prior to admission. Patient describes shortness of breath that initially exertional only an over the past week present at rest. This has been associated with subjective fevers and chills, nausea. In the ED, CXR was c/w emphysema. He was started on bipap for tachypnea and respiratory distress. He has initially required BiPAP and was able to transition to 2L nasal cannula after about 30 minutes. Pt was given albuterol nebulizer, solumedrol, Levaquin in ED.  Active Problems:  Acute respiratory failure with hypoxia - this is mostly multifactorial in etiology and secondary acute COPD exacerbation, flu - Influenza panel positive for H1N1 - We'll continue to provide supportive care along with empiric antibiotic Levaquin, continue Tamiflu - follow up on sputum culture and gram stain  - nebulizers as needed, solumedrol   COPD (chronic obstructive pulmonary disease) - clinically imprioving  - continue supportive care for now - empiric antibiotics    PTSD (post-traumatic stress disorder) - stable clinically   Tobacco abuse - discussed cessation   Sinus tachycardia - in the setting of nebulizer treatment - monitor vitals per floor protocol   Borderline diabetes - will monitor closely since pt is on solumedrol   Consultants:  None  Procedures/Studies: Dg Chest Port 1 View 12/01/2012   Hyperexpansion compatible with emphysema.  No acute abnormality.     Antibiotics:  Levaquin 0204 -->  Tamiflu 0205 to  -->  Code Status: Full Family Communication: Pt at bedside Disposition Plan: Home when  medically stable  HPI/Subjective: No events overnight.   Objective: Filed Vitals:   12/02/12 0332 12/02/12 0730 12/02/12 0800 12/02/12 1211  BP:      Pulse:      Temp:   97.5 F (36.4 C)   TempSrc:   Oral   Resp:      SpO2: 93% 92%  96%    Intake/Output Summary (Last 24 hours) at 12/02/12 1357 Last data filed at 12/02/12 0600  Gross per 24 hour  Intake    760 ml  Output    375 ml  Net    385 ml    Exam:   General:  Pt is alert, follows commands appropriately, not in acute distress  Cardiovascular: Regular rate and rhythm, S1/S2, no murmurs, no rubs, no gallops  Respiratory: Decreased breath sounds in bases, bilateral rales, mild end expiratory wheezing  Abdomen: Soft, non tender, non distended, bowel sounds present, no guarding  Extremities: No edema, pulses DP and PT palpable bilaterally  Neuro: Grossly nonfocal  Data Reviewed: Basic Metabolic Panel:  Lab 12/02/12 9562 12/01/12 1550  NA 135 136  K 3.7 4.0  CL 97 96  CO2 27 27  GLUCOSE 142* 108*  BUN 14 8  CREATININE 0.84 0.91  CALCIUM 8.7 8.6  MG -- --  PHOS -- --   Liver Function Tests:  Lab 12/01/12 1550  AST 28  ALT 16  ALKPHOS 70  BILITOT 0.8  PROT 7.7  ALBUMIN 3.7   CBC:  Lab 12/02/12 0335 12/01/12 1630  WBC 6.3 10.4  NEUTROABS -- 9.9*  HGB 13.3 13.9  HCT 39.1 41.5  MCV 89.3  90.4  PLT 144* 126*   Cardiac Enzymes:  Lab 12/01/12 1550  CKTOTAL --  CKMB --  CKMBINDEX --  TROPONINI <0.30   CBG:  Lab 12/02/12 1210 12/02/12 0751 12/01/12 2225  GLUCAP 138* 147* 168*    Recent Results (from the past 240 hour(s))  MRSA PCR SCREENING     Status: Normal   Collection Time   12/01/12  9:49 PM      Component Value Range Status Comment   MRSA by PCR NEGATIVE  NEGATIVE Final      Scheduled Meds:   . ipratropium  0.5 mg Nebulization Q4H   And  . albuterol  2.5 mg Nebulization Q4H  . benzonatate  200 mg Oral TID  . budesonide nebulizer sol  0.5 mg Nebulization BID  . docusate  sodium  100 mg Oral BID  . doxepin  50 mg Oral QHS  . enoxaparin  injection  40 mg Subcutaneous Q24H  . fluticasone  2 spray Each Nare Daily  . insulin aspart  0-9 Units Subcutaneous TID WC  . levofloxacin IV  750 mg Intravenous Q24H  . methylPREDNISolone inj  60 mg Intravenous Q12H  . oseltamivir  75 mg Oral BID  . oxymetazoline  1 spray Each Nare BID  . pantoprazole  40 mg Oral BID AC  . senna  1 tablet Oral BID  . sertraline  100 mg Oral Daily  . zolpidem  10 mg Oral QHS   Continuous Infusions:    Debbora Presto, MD  [facility] Pager [phone]  If 7PM-7AM, please contact night-coverage [email or web address] Password [email or web address] 12/02/2012, 1:57 PM   LOS: 1 day

## 2012-12-02 NOTE — Progress Notes (Signed)
Pt has been encouraged to have security lock valuables (money) in safe numerous times but still refuses. He verbalized understanding that the hospital is not responsible for any lost valuables at this time and continues to wish valuables to remain in his room.

## 2012-12-02 NOTE — Evaluation (Signed)
Occupational Therapy Evaluation Patient Details Name: [name] [name] MRN: [id number] DOB: 06/01/1946 Today's Date: 12/02/2012 Time: 3086-5784 OT Time Calculation (min): 21 min  OT Assessment / Plan / Recommendation Clinical Impression  Pt. is 67 yo male admitted with respiratory distress. O2 sats never fell below 90 on RA with mobility. Skilled OT indicated to maximize independence with BADLs to mod I level in prep for safe d/c home.    OT Assessment  Patient needs continued OT Services    Follow Up Recommendations  No OT follow up;Home health OT (depending on progress)    Barriers to Discharge      Equipment Recommendations  None recommended by OT    Recommendations for Other Services    Frequency  Min 2X/week    Precautions / Restrictions Precautions Precaution Comments: dyspnea, impulsive Restrictions Weight Bearing Restrictions: No   Pertinent Vitals/Pain Denied pain    ADL  Grooming: Min guard Where Assessed - Grooming: Supported standing Upper Body Bathing: Set up Where Assessed - Upper Body Bathing: Unsupported sitting Lower Body Bathing: Minimal assistance Where Assessed - Lower Body Bathing: Supported sit to stand Upper Body Dressing: Set up Where Assessed - Upper Body Dressing: Unsupported sitting Lower Body Dressing: Minimal assistance Where Assessed - Lower Body Dressing: Supported sit to stand Toilet Transfer: Hydrographic surveyor Method: Sit to Barista: Other (comment) (to recliner) Toileting - Clothing Manipulation and Hygiene: Min guard Where Assessed - Toileting Clothing Manipulation and Hygiene: Standing Transfers/Ambulation Related to ADLs: Pt ambulated a short distance with RW pushing IV pole. 2/4 DOE. Pt fatigues very quickly. Can be impulsive at times.    OT Diagnosis: Generalized weakness  OT Problem List: Decreased activity tolerance;Decreased safety awareness;Decreased knowledge of use of DME or AE OT Treatment  Interventions: Self-care/ADL training;Therapeutic activities;Energy conservation;DME and/or AE instruction;Patient/family education   OT Goals Acute Rehab OT Goals OT Goal Formulation: With patient Time For Goal Achievement: 12/16/12 Potential to Achieve Goals: Good ADL Goals Pt Will Perform Grooming: with modified independence;Standing at sink ADL Goal: Grooming - Progress: Goal set today Pt Will Transfer to Toilet: with modified independence;Ambulation;Comfort height toilet;Regular height toilet ADL Goal: Toilet Transfer - Progress: Goal set today Pt Will Perform Toileting - Clothing Manipulation: with modified independence;Sitting on 3-in-1 or toilet;Standing ADL Goal: Toileting - Clothing Manipulation - Progress: Goal set today Pt Will Perform Toileting - Hygiene: with modified independence;Sit to stand from 3-in-1/toilet ADL Goal: Toileting - Hygiene - Progress: Goal set today Pt Will Perform Tub/Shower Transfer: Shower transfer;with modified independence;Ambulation ADL Goal: Tub/Shower Transfer - Progress: Goal set today Additional ADL Goal #1: Pt will complete all aspects of bathing and dressing including item retrieval at mod I level taking prn RBs without prompting. ADL Goal: Additional Goal #1 - Progress: Goal set today Additional ADL Goal #2: Pt will verbalize 3 energy conservation strategies during IADLS with min cueing. ADL Goal: Additional Goal #2 - Progress: Goal set today  Visit Information  Last OT Received On: 12/02/12 Assistance Needed: +1 PT/OT Co-Evaluation/Treatment: Yes    Subjective Data  Subjective: I take care of my horses [place] a farm. Patient Stated Goal: Not asked.   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 Communication Communication: No difficulties Dominant Hand: Right         Vision/Perception     Cognition  Cognition Overall Cognitive Status: Appears within functional limits for tasks assessed/performed Arousal/Alertness: Awake/alert Orientation Level: Appears intact for tasks assessed Behavior During Session: [facility] for tasks performed Cognition - Other Comments: impulsive    Extremity/Trunk Assessment Right Upper Extremity Assessment RUE ROM/Strength/Tone: [facility] for tasks assessed Left Upper Extremity Assessment LUE ROM/Strength/Tone: WFL for tasks assessed Right Lower Extremity Assessment RLE ROM/Strength/Tone: [facility] for tasks assessed;Deficits RLE ROM/Strength/Tone Deficits: shakey,jerky RLE Sensation: WFL - Light Touch Left Lower Extremity Assessment LLE ROM/Strength/Tone: WFL for tasks assessed;Deficits LLE ROM/Strength/Tone Deficits: same as R LLE Sensation: WFL - Light Touch     Mobility Bed Mobility Bed Mobility: Supine to Sit Supine to Sit: 5: Supervision;HOB elevated;With rails Transfers Sit to Stand: 4: Min assist;From bed;With upper extremity assist Stand to Sit: With armrests;To chair/3-in-1;With upper extremity assist;4: Min guard Details for Transfer Assistance: cues for safe transtions, use of UE's     Exercise     Balance Balance Balance Assessed: Yes Static Sitting Balance Static Sitting - Balance Support: No upper extremity supported;Feet supported Static Sitting - Level of Assistance: 7: Independent   End of Session OT - End of Session Activity Tolerance: Patient limited by  fatigue Patient left: in chair;with call bell/phone within reach  GO Functional Assessment Tool Used: Clinical Judgement Functional Limitation: Self care Self Care Current Status (Z6109): At least 1 percent but less than 20 percent impaired, limited or restricted Self Care Goal Status (U0454): 0 percent impaired, limited or restricted Self Care Discharge Status [phone]): 0 percent impaired, limited or restricted   Krithika Tome A OTR/L [phone] 12/02/2012, 11:20 AM

## 2012-12-03 LAB — GLUCOSE, CAPILLARY
Glucose-Capillary: 139 mg/dL — ABNORMAL HIGH (ref 70–99)
Glucose-Capillary: 149 mg/dL — ABNORMAL HIGH (ref 70–99)

## 2012-12-03 LAB — BASIC METABOLIC PANEL
CO2: 27 mEq/L (ref 19–32)
Calcium: 8.7 mg/dL (ref 8.4–10.5)
Creatinine, Ser: 0.86 mg/dL (ref 0.50–1.35)
Glucose, Bld: 165 mg/dL — ABNORMAL HIGH (ref 70–99)

## 2012-12-03 LAB — CBC
HCT: 36.6 % — ABNORMAL LOW (ref 39.0–52.0)
Hemoglobin: 12.2 g/dL — ABNORMAL LOW (ref 13.0–17.0)
MCH: 30 pg (ref 26.0–34.0)
MCV: 90.1 fL (ref 78.0–100.0)
RBC: 4.06 MIL/uL — ABNORMAL LOW (ref 4.22–5.81)

## 2012-12-03 MED ORDER — MENTHOL 3 MG MT LOZG
1.0000 | LOZENGE | OROMUCOSAL | Status: DC | PRN
  Administered 2012-12-03: 3 mg via ORAL
  Filled 2012-12-03 (×2): qty 9

## 2012-12-03 MED ORDER — CEPASTAT 14.5 MG MT LOZG: 1.0000 | LOZENGE | OROMUCOSAL | Status: DC | PRN

## 2012-12-03 MED ORDER — HYDRALAZINE HCL 20 MG/ML IJ SOLN
5.0000 mg | Freq: Four times a day (QID) | INTRAMUSCULAR | Status: DC | PRN
  Administered 2012-12-03 – 2012-12-05 (×2): 5 mg via INTRAVENOUS
  Filled 2012-12-03 (×2): qty 1

## 2012-12-03 NOTE — Progress Notes (Signed)
PT Cancellation Note  Patient Details Name: Lyric Hoar MRN: [id number] DOB: 01/23/1946   Cancelled Treatment:    Reason Eval/Treat Not Completed: Other (comment) (pt reports being too SOB today)   Walterine Amodei,KATHrine E 12/03/2012, 2:48 PM Pager: [phone]

## 2012-12-03 NOTE — Progress Notes (Signed)
Patient ID: [name] [name], male   DOB: 01-Mar-1946, 67 y.o.   MRN: [id number]  [facility] PROGRESS NOTE  Jacarie Pate GNF:[id number] DOB: 1946-08-05 DOA: 12/01/2012 PCP: No primary provider on file.  Brief narrative:  The patient is a 67 yo male with history of COPD and PTSD who presented to [facility] ED with main concern of progressively worsening shortness of breath that initially started one month prior to admission. Patient describes shortness of breath that initially exertional only an over the past week present at rest. This has been associated with subjective fevers and chills, nausea.   In the ED, CXR was c/w emphysema. He was started on bipap for tachypnea and respiratory distress. He has initially required BiPAP and was able to transition to 2L nasal cannula after about 30 minutes. Pt was given albuterol nebulizer, solumedrol, Levaquin in ED.   Active Problems:  Acute respiratory failure with hypoxia  - this is mostly multifactorial in etiology and secondary acute COPD exacerbation, flu  - Influenza panel positive for H1N1  - We'll continue to provide supportive care along with empiric antibiotic Levaquin, continue Tamiflu  - nebulizers as needed, plan on tapering down solumedrol  COPD (chronic obstructive pulmonary disease)  - clinically improving - continue supportive care as noted above   PTSD (post-traumatic stress disorder)  - stable clinically  Tobacco abuse  - discussed cessation  Sinus tachycardia  - in the setting of nebulizer treatment  - now stable and HR is within normal limits  Borderline diabetes  - will monitor closely since pt is on solumedrol   Consultants:  None Procedures/Studies:  Dg Chest Port 1 View  12/01/2012  Hyperexpansion compatible with emphysema. No acute abnormality.  Antibiotics:  Levaquin 02/04 -->  Tamiflu 02/05 -->  Code Status: Full  Family Communication: Pt at bedside  Disposition Plan: Transfer to telemetry floor    HPI/Subjective: No events overnight.   Objective: Filed Vitals:   12/03/12 1139 12/03/12 1200 12/03/12 1219 12/03/12 1520  BP:  160/96  164/95  Pulse:  93  99  Temp:   97.6 F (36.4 C) 97.4 F (36.3 C)  TempSrc:   Oral Oral  Resp:  25  24  Height:      Weight:      SpO2: 100% 98%  99%    Intake/Output Summary (Last 24 hours) at 12/03/12 1522 Last data filed at 12/03/12 1430  Gross per 24 hour  Intake   1060 ml  Output   1915 ml  Net   -855 ml    Exam:   General:  Pt is alert, follows commands appropriately, not in acute distress  Cardiovascular: Regular rate and rhythm, S1/S2, no murmurs, no rubs, no gallops  Respiratory: Clear to auscultation bilaterally, bilateral rales, no wheezing   Abdomen: Soft, non tender, non distended, bowel sounds present, no guarding  Extremities: No edema, pulses DP and PT palpable bilaterally  Neuro: Grossly nonfocal  Data Reviewed: Basic Metabolic Panel:  Lab 12/03/12 8469 12/02/12 0335 12/01/12 1550  NA 136 135 136  K 4.0 3.7 4.0  CL 99 97 96  CO2 27 27 27   GLUCOSE 165* 142* 108*  BUN 16 14 8   CREATININE 0.86 0.84 0.91  CALCIUM 8.7 8.7 8.6  MG -- -- --  PHOS -- -- --   Liver Function Tests:  Lab 12/01/12 1550  AST 28  ALT 16  ALKPHOS 70  BILITOT 0.8  PROT 7.7  ALBUMIN 3.7   CBC:  Lab 12/03/12  0330 12/02/12 0335 12/01/12 1630  WBC 11.4* 6.3 10.4  NEUTROABS -- -- 9.9*  HGB 12.2* 13.3 13.9  HCT 36.6* 39.1 41.5  MCV 90.1 89.3 90.4  PLT 149* 144* 126*   Cardiac Enzymes:  Lab 12/01/12 1550  CKTOTAL --  CKMB --  CKMBINDEX --  TROPONINI <0.30   CBG:  Lab 12/03/12 1147 12/03/12 0736 12/02/12 2111 12/02/12 1750 12/02/12 1210  GLUCAP 152* 139* 116* 151* 138*    Recent Results (from the past 240 hour(s))  MRSA PCR SCREENING     Status: Normal   Collection Time   12/01/12  9:49 PM      Component Value Range Status Comment   MRSA by PCR NEGATIVE  NEGATIVE Final      Scheduled Meds:   . ipratropium   0.5 mg Nebulization Q4H   And  . albuterol  2.5 mg Nebulization Q4H  . antiseptic oral rinse  15 mL Mouth Rinse q12n4p  . benzonatate  200 mg Oral TID  . budesonide (PULMICORT) nebulizer solution  0.5 mg Nebulization BID  . chlorhexidine  15 mL Mouth Rinse BID  . docusate sodium  100 mg Oral BID  . doxepin  50 mg Oral QHS  . enoxaparin (LOVENOX) injection  40 mg Subcutaneous Q24H  . fluticasone  2 spray Each Nare Daily  . insulin aspart  0-9 Units Subcutaneous TID WC  . levofloxacin (LEVAQUIN) IV  750 mg Intravenous Q24H  . methylPREDNISolone (SOLU-MEDROL) injection  60 mg Intravenous Q12H  . oseltamivir  75 mg Oral BID  . oxymetazoline  1 spray Each Nare BID  . pantoprazole  40 mg Oral BID AC  . senna  1 tablet Oral BID  . sertraline  100 mg Oral Daily  . sodium chloride  3 mL Intravenous Q12H  . zolpidem  10 mg Oral QHS   Continuous Infusions:    Debbora Presto, MD  [facility] Pager [phone]  If 7PM-7AM, please contact night-coverage [email or web address] Password [email or web address] 12/03/2012, 3:22 PM   LOS: 2 days

## 2012-12-04 LAB — CBC
MCH: 30.1 pg (ref 26.0–34.0)
Platelets: 148 10*3/uL — ABNORMAL LOW (ref 150–400)
RBC: 4.25 MIL/uL (ref 4.22–5.81)
WBC: 8.8 10*3/uL (ref 4.0–10.5)

## 2012-12-04 LAB — BASIC METABOLIC PANEL
CO2: 30 mEq/L (ref 19–32)
Calcium: 9 mg/dL (ref 8.4–10.5)
GFR calc Af Amer: >90 mL/min (ref >90)
Sodium: 136 mEq/L (ref 135–145)

## 2012-12-04 LAB — GLUCOSE, CAPILLARY

## 2012-12-04 MED ORDER — PREDNISONE 50 MG PO TABS
50.0000 mg | ORAL_TABLET | Freq: Every day | ORAL | Status: DC
  Administered 2012-12-05 – 2012-12-06 (×2): 50 mg via ORAL
  Filled 2012-12-04 (×3): qty 1

## 2012-12-04 MED ORDER — LEVOFLOXACIN 750 MG PO TABS
750.0000 mg | ORAL_TABLET | ORAL | Status: DC
  Administered 2012-12-04 – 2012-12-05 (×2): 750 mg via ORAL
  Filled 2012-12-04 (×3): qty 1

## 2012-12-04 NOTE — Progress Notes (Signed)
Patient ID: [name] [name], male   DOB: [birth year], 67 y.o.   MRN: [id number]  [facility] PROGRESS NOTE  [name] [name] WNU:[id number] DOB: [birth year] DOA: 12/01/2012 PCP: No primary provider on file.  Brief narrative:  The patient is a 67 yo male with history of COPD and PTSD who presented to [facility] ED with main concern of progressively worsening shortness of breath that initially started one month prior to admission. Patient describes shortness of breath that initially exertional only an over the past week present at rest. This has been associated with subjective fevers and chills, nausea.   In the ED, CXR was c/w emphysema. He was started on bipap for tachypnea and respiratory distress. He has initially required BiPAP and was able to transition to 2L nasal cannula after about 30 minutes. Pt was given albuterol nebulizer, solumedrol, Levaquin in ED.   Active Problems:  Acute respiratory failure with hypoxia  - this is mostly multifactorial in etiology and secondary acute COPD exacerbation, flu  - Influenza panel positive for H1N1  - pt clinically improving - We'll continue to provide supportive care along with empiric antibiotic Levaquin, continue Tamiflu  - nebulizers as needed, discontinue Solumedrol and transition to Prednisone  COPD (chronic obstructive pulmonary disease)  - clinically improving and maintaining oxygen saturations at target range  - continue supportive care as noted above  PTSD (post-traumatic stress disorder)  - stable clinically  Tobacco abuse  - discussed cessation  Sinus tachycardia  - in the setting of nebulizer treatment  - now stable and HR is within normal limits  Borderline diabetes  - will monitor closely since pt is on steroids   Consultants:  None Procedures/Studies:  Dg Chest Port 1 View  12/01/2012  Hyperexpansion compatible with emphysema. No acute abnormality.  Antibiotics:  Levaquin 02/04 -->  Tamiflu 02/05 -->  Code Status: Full   Family Communication: Pt at bedside  Disposition Plan: Plan d/c in 1-2 days  HPI/Subjective: No events overnight. Pt denies shortness of breath or chest pain.   Objective: Filed Vitals:   12/04/12 0036 12/04/12 0357 12/04/12 0510 12/04/12 0736  BP: 133/66  119/74   Pulse: 98  95   Temp:   97.6 F (36.4 C)   TempSrc:   Oral   Resp:   22   Height:      Weight:      SpO2:  98% 97% 98%    Intake/Output Summary (Last 24 hours) at 12/04/12 1042 Last data filed at 12/04/12 0830  Gross per 24 hour  Intake   2290 ml  Output   1975 ml  Net    315 ml    Exam:   General:  Pt is alert, follows commands appropriately, not in acute distress  Cardiovascular: Regular rate and rhythm, S1/S2, no murmurs, no rubs, no gallops  Respiratory: Clear to auscultation bilaterally, mild end expiratory wheezing   Abdomen: Soft, non tender, non distended, bowel sounds present, no guarding  Extremities: No edema, pulses DP and PT palpable bilaterally  Neuro: Grossly nonfocal  Data Reviewed: Basic Metabolic Panel:  Lab 12/04/12 0347 12/03/12 0330 12/02/12 0335 12/01/12 1550  NA 136 136 135 136  K 3.6 4.0 3.7 4.0  CL 97 99 97 96  CO2 30 27 27 27   GLUCOSE 134* 165* 142* 108*  BUN 16 16 14 8   CREATININE 0.75 0.86 0.84 0.91  CALCIUM 9.0 8.7 8.7 8.6  MG -- -- -- --  PHOS -- -- -- --   Liver  Function Tests:  Lab 12/01/12 1550  AST 28  ALT 16  ALKPHOS 70  BILITOT 0.8  PROT 7.7  ALBUMIN 3.7   CBC:  Lab 12/04/12 0438 12/03/12 0330 12/02/12 0335 12/01/12 1630  WBC 8.8 11.4* 6.3 10.4  NEUTROABS -- -- -- 9.9*  HGB 12.8* 12.2* 13.3 13.9  HCT 38.2* 36.6* 39.1 41.5  MCV 89.9 90.1 89.3 90.4  PLT 148* 149* 144* 126*   Cardiac Enzymes:  Lab 12/01/12 1550  CKTOTAL --  CKMB --  CKMBINDEX --  TROPONINI <0.30  CBG:  Lab 12/04/12 0733 12/03/12 1641 12/03/12 1147 12/03/12 0736 12/02/12 2111  GLUCAP 119* 149* 152* 139* 116*    Recent Results (from the past 240 hour(s))  MRSA PCR  SCREENING     Status: Normal   Collection Time   12/01/12  9:49 PM      Component Value Range Status Comment   MRSA by PCR NEGATIVE  NEGATIVE Final   CULTURE, BLOOD (ROUTINE X 2)     Status: Normal (Preliminary result)   Collection Time   12/02/12  2:52 PM      Component Value Range Status Comment   Specimen Description BLOOD RIGHT ARM   Final    Special Requests BOTTLES DRAWN AEROBIC AND ANAEROBIC 5CC   Final    Culture  Setup Time 12/03/2012 00:19   Final    Culture     Final    Value:        BLOOD CULTURE RECEIVED NO GROWTH TO DATE CULTURE WILL BE HELD FOR 5 DAYS BEFORE ISSUING A FINAL NEGATIVE REPORT   Report Status PENDING   Incomplete   CULTURE, BLOOD (ROUTINE X 2)     Status: Normal (Preliminary result)   Collection Time   12/02/12  2:55 PM      Component Value Range Status Comment   Specimen Description BLOOD RIGHT HAND   Final    Special Requests BOTTLES DRAWN AEROBIC AND ANAEROBIC 5CC   Final    Culture  Setup Time 12/03/2012 00:19   Final    Culture     Final    Value:        BLOOD CULTURE RECEIVED NO GROWTH TO DATE CULTURE WILL BE HELD FOR 5 DAYS BEFORE ISSUING A FINAL NEGATIVE REPORT   Report Status PENDING   Incomplete      Scheduled Meds:   . ipratropium  0.5 mg Nebulization Q4H   And  . albuterol  2.5 mg Nebulization Q4H  . antiseptic oral rinse  15 mL Mouth Rinse q12n4p  . benzonatate  200 mg Oral TID  . budesonide (PULMICORT) nebulizer solution  0.5 mg Nebulization BID  . chlorhexidine  15 mL Mouth Rinse BID  . docusate sodium  100 mg Oral BID  . doxepin  50 mg Oral QHS  . enoxaparin (LOVENOX) injection  40 mg Subcutaneous Q24H  . fluticasone  2 spray Each Nare Daily  . insulin aspart  0-9 Units Subcutaneous TID WC  . levofloxacin (LEVAQUIN) IV  750 mg Intravenous Q24H  . methylPREDNISolone (SOLU-MEDROL) injection  60 mg Intravenous Q12H  . oseltamivir  75 mg Oral BID  . pantoprazole  40 mg Oral BID AC  . senna  1 tablet Oral BID  . sertraline  100 mg Oral  Daily  . sodium chloride  3 mL Intravenous Q12H  . zolpidem  10 mg Oral QHS   Continuous Infusions:    Debbora Presto, MD  [facility] Pager [phone]  If 7PM-7AM, please contact night-coverage [email or web address] Password [email or web address] 12/04/2012, 10:42 AM   LOS: 3 days

## 2012-12-04 NOTE — Progress Notes (Signed)
PT Cancellation Note  Patient Details Name: [name] [name] MRN: [id number] DOB: [birth year]   Cancelled Treatment:    Reason Eval/Treat Not Completed: Medical issues which prohibited therapy Pt continues to report being too SOB to ambulate today, also declined even getting up to recliner but states will try later with nsg after nephew helps with bathing.   Danniella Robben,KATHrine E 12/04/2012, 2:00 PM Pager: [phone]

## 2012-12-04 NOTE — Care Management Note (Signed)
    Page 1 of 1   12/07/2012     11:42:27 AM   CARE MANAGEMENT NOTE 12/07/2012  Patient:  [name] [name],[name] [name]   Account Number:  [id number]  Date Initiated:  12/04/2012  Documentation initiated by:  Lanier Clam  Subjective/Objective Assessment:   ADMITTED W/SOB.COPD.     Action/Plan:   FROM HOME.HAS PCP,PHARMACY.   Anticipated DC Date:  12/08/2012   Anticipated DC Plan:  HOME W HOME HEALTH SERVICES      DC Planning Services  CM consult      Choice offered to / List presented to:  C-1 Patient           Status of service:  Completed, signed off Medicare Important Message given?   (If response is "NO", the following Medicare IM given date fields will be blank) Date Medicare IM given:   Date Additional Medicare IM given:    Discharge Disposition:  HOME/SELF CARE  Per UR Regulation:  Reviewed for med. necessity/level of care/duration of stay  If discussed at Long Length of Stay Meetings, dates discussed:    Comments:  12/07/12 [facility] RN, BSN NCM [phone] NO D/C ORDERS ON 12/06/12.ALSO CHECKED W/[facility]AN ABOUT REFERRAL,BUT SHE ALSO SAW NO HHC ORDERS.  12/04/12 Chika Cichowski RN,BSN NCM [phone] [facility]OS[facility], SUSAN(LIASON) AWARE OF REFERRAL & FOLLOWING FOR HHPT ORDER. TRANSFERRED FROM SDU.PT-HH.PROVIDED [facility] AGENCY LIST.

## 2012-12-04 NOTE — Progress Notes (Signed)
ANTIBIOTIC CONSULT NOTE - FOLLOW UP  Pharmacy Consult for Levaquin Indication: COPD Exacerbation  No Known Allergies  Patient Measurements: Height: 5\' 8"  (172.7 cm) Weight: 169 lb 5 oz (76.8 kg) IBW/kg (Calculated) : 68.4   Vital Signs: Temp: 97.6 F (36.4 C) (02/07 0510) Temp src: Oral (02/07 0510) BP: 119/74 mmHg (02/07 0510) Pulse Rate: 95  (02/07 0510)  Labs:  Basename 12/04/12 0438 12/03/12 0330 12/02/12 0335  WBC 8.8 11.4* 6.3  HGB 12.8* 12.2* 13.3  PLT 148* 149* 144*  LABCREA -- -- --  CREATININE 0.75 0.86 0.84   Estimated Creatinine Clearance: 86.7 ml/min (by C-G formula based on Cr of 0.75).    Microbiology: Recent Results (from the past 720 hour(s))  MRSA PCR SCREENING     Status: Normal   Collection Time   12/01/12  9:49 PM      Component Value Range Status Comment   MRSA by PCR NEGATIVE  NEGATIVE Final   CULTURE, BLOOD (ROUTINE X 2)     Status: Normal (Preliminary result)   Collection Time   12/02/12  2:52 PM      Component Value Range Status Comment   Specimen Description BLOOD RIGHT ARM   Final    Special Requests BOTTLES DRAWN AEROBIC AND ANAEROBIC 5CC   Final    Culture  Setup Time 12/03/2012 00:19   Final    Culture     Final    Value:        BLOOD CULTURE RECEIVED NO GROWTH TO DATE CULTURE WILL BE HELD FOR 5 DAYS BEFORE ISSUING A FINAL NEGATIVE REPORT   Report Status PENDING   Incomplete   CULTURE, BLOOD (ROUTINE X 2)     Status: Normal (Preliminary result)   Collection Time   12/02/12  2:55 PM      Component Value Range Status Comment   Specimen Description BLOOD RIGHT HAND   Final    Special Requests BOTTLES DRAWN AEROBIC AND ANAEROBIC 5CC   Final    Culture  Setup Time 12/03/2012 00:19   Final    Culture     Final    Value:        BLOOD CULTURE RECEIVED NO GROWTH TO DATE CULTURE WILL BE HELD FOR 5 DAYS BEFORE ISSUING A FINAL NEGATIVE REPORT   Report Status PENDING   Incomplete     Anti-infectives     Start     Dose/Rate Route Frequency  Ordered Stop   12/02/12 1600   levofloxacin (LEVAQUIN) IVPB 750 mg        750 mg 100 mL/hr over 90 Minutes Intravenous Every 24 hours 12/01/12 2052     12/02/12 1400   oseltamivir (TAMIFLU) capsule 75 mg        75 mg Oral 2 times daily 12/02/12 1326 12/07/12 0959   12/01/12 1615   levofloxacin (LEVAQUIN) IVPB 750 mg        750 mg 100 mL/hr over 90 Minutes Intravenous  Once 12/01/12 1603 12/01/12 1808          Assessment: 67 yo M on Day #4 Levaquin 750mg  IV q24h for COPD exacerbation. Afebrile, SCr stable, WBC now <10. Will change to PO Levaquin today based on criteria approved by the Pharmacy and Therapeutics Committee, listed below:  Patient being treated for a respiratory tract infection, urinary tract infection, or cellulitis  The patient is not neutropenic and does not exhibit a GI malabsorption state  The patient is eating (either orally or via tube)  and/or has been taking other orally administered medications for a least 24 hours  The patient is improving clinically and has a Tmax < 100.5  Plan:  Change to Levaquin 750mg  PO q24h  Darrol Angel, PharmD Pager: [phone] 12/04/2012,12:36 PM

## 2012-12-05 LAB — BASIC METABOLIC PANEL
BUN: 15 mg/dL (ref 6–23)
CO2: 32 mEq/L (ref 19–32)
Chloride: 98 mEq/L (ref 96–112)
Glucose, Bld: 108 mg/dL — ABNORMAL HIGH (ref 70–99)
Potassium: 3.2 mEq/L — ABNORMAL LOW (ref 3.5–5.1)

## 2012-12-05 LAB — GLUCOSE, CAPILLARY
Glucose-Capillary: 94 mg/dL (ref 70–99)
Glucose-Capillary: 93 mg/dL (ref 70–99)
Glucose-Capillary: 169 mg/dL — ABNORMAL HIGH (ref 70–99)

## 2012-12-05 LAB — CBC
HCT: 38.3 % — ABNORMAL LOW (ref 39.0–52.0)
Hemoglobin: 12.7 g/dL — ABNORMAL LOW (ref 13.0–17.0)
MCHC: 33.2 g/dL (ref 30.0–36.0)
RBC: 4.26 MIL/uL (ref 4.22–5.81)
WBC: 7.4 10*3/uL (ref 4.0–10.5)

## 2012-12-05 MED ORDER — IPRATROPIUM BROMIDE 0.02 % IN SOLN: 0.5000 mg | RESPIRATORY_TRACT | Status: DC

## 2012-12-05 MED ORDER — ALBUTEROL SULFATE (5 MG/ML) 0.5% IN NEBU: 2.5000 mg | INHALATION_SOLUTION | Freq: Four times a day (QID) | RESPIRATORY_TRACT | Status: DC

## 2012-12-05 MED ORDER — IPRATROPIUM BROMIDE 0.02 % IN SOLN: 0.5000 mg | Freq: Four times a day (QID) | RESPIRATORY_TRACT | Status: DC

## 2012-12-05 MED ORDER — IPRATROPIUM BROMIDE 0.02 % IN SOLN
0.5000 mg | Freq: Four times a day (QID) | RESPIRATORY_TRACT | Status: DC
  Administered 2012-12-06: 0.5 mg via RESPIRATORY_TRACT

## 2012-12-05 MED ORDER — ALBUTEROL SULFATE (5 MG/ML) 0.5% IN NEBU
2.5000 mg | INHALATION_SOLUTION | Freq: Four times a day (QID) | RESPIRATORY_TRACT | Status: DC
  Administered 2012-12-06: 2.5 mg via RESPIRATORY_TRACT
  Filled 2012-12-05: qty 0.5

## 2012-12-05 NOTE — Progress Notes (Signed)
Patient ID: [name] [name], male   DOB: [birth year], 67 y.o.   MRN: [id number]  [facility] PROGRESS NOTE  Naythen Heikkila NWG:[id number] DOB: 1946-08-12 DOA: 12/01/2012 PCP: No primary provider on file.  Brief narrative:  The patient is a 67 yo male with history of COPD and PTSD who presented to [facility] ED with main concern of progressively worsening shortness of breath that initially started one month prior to admission. Patient describes shortness of breath that initially exertional only an over the past week present at rest. This has been associated with subjective fevers and chills, nausea.  In the ED, CXR was c/w emphysema. He was started on bipap for tachypnea and respiratory distress. He has initially required BiPAP and was able to transition to 2L nasal cannula after about 30 minutes. Pt was given albuterol nebulizer, solumedrol, Levaquin in ED.   Active Problems:  Acute respiratory failure with hypoxia  - this is mostly multifactorial in etiology and secondary acute COPD exacerbation, flu  - Influenza panel positive for H1N1  - pt clinically improving with only mild wheezing on exam this AM - We'll continue to provide supportive care along with empiric antibiotic Levaquin, continue Tamiflu  - nebulizers as needed, continue Prednisone  COPD (chronic obstructive pulmonary disease)  - clinically improving and maintaining oxygen saturations at target range  - continue supportive care as noted above  - check oxygen saturations with ambulation to determine oxygen need upon discharge at home PTSD (post-traumatic stress disorder)  - stable clinically  Tobacco abuse  - discussed cessation  Sinus tachycardia  - now stable and HR is within normal limits  Borderline diabetes  - reasonable control while inpatient   Consultants:  None Procedures/Studies:  Dg Chest Port 1 View  12/01/2012  Hyperexpansion compatible with emphysema. No acute abnormality.  Antibiotics:  Levaquin 02/04 -->   Tamiflu 02/05 -->  Code Status: Full  Family Communication: Pt at bedside  Disposition Plan: Plan d/c in AM  HPI/Subjective: No events overnight.   Objective: Filed Vitals:   12/05/12 0818 12/05/12 1118 12/05/12 1134 12/05/12 1337  BP:  157/87  177/90  Pulse:  86  92  Temp:  97.7 F (36.5 C)  97.5 F (36.4 C)  TempSrc:  Oral  Oral  Resp:  18  18  Height:      Weight:      SpO2: 94% 97% 95% 98%    Intake/Output Summary (Last 24 hours) at 12/05/12 1442 Last data filed at 12/05/12 1300  Gross per 24 hour  Intake    718 ml  Output    827 ml  Net   -109 ml    Exam:   General:  Pt is alert, follows commands appropriately, not in acute distress  Cardiovascular: Regular rate and rhythm, S1/S2, no murmurs, no rubs, no gallops  Respiratory: Clear to auscultation bilaterally, minimal end expiratory wheezing   Abdomen: Soft, non tender, non distended, bowel sounds present, no guarding  Extremities: No edema, pulses DP and PT palpable bilaterally  Neuro: Grossly nonfocal  Data Reviewed: Basic Metabolic Panel:  Recent Labs Lab 12/01/12 1550 12/02/12 0335 12/03/12 0330 12/04/12 0438 12/05/12 0449  NA 136 135 136 136 139  K 4.0 3.7 4.0 3.6 3.2*  CL 96 97 99 97 98  CO2 27 27 27 30  32  GLUCOSE 108* 142* 165* 134* 108*  BUN 8 14 16 16 15   CREATININE 0.91 0.84 0.86 0.75 0.79  CALCIUM 8.6 8.7 8.7 9.0 8.5   Liver  Function Tests:  Recent Labs Lab 12/01/12 1550  AST 28  ALT 16  ALKPHOS 70  BILITOT 0.8  PROT 7.7  ALBUMIN 3.7   No results found for this basename: LIPASE, AMYLASE,  in the last 168 hours No results found for this basename: AMMONIA,  in the last 168 hours CBC:  Recent Labs Lab 12/01/12 1630 12/02/12 0335 12/03/12 0330 12/04/12 0438 12/05/12 0449  WBC 10.4 6.3 11.4* 8.8 7.4  NEUTROABS 9.9*  --   --   --   --   HGB 13.9 13.3 12.2* 12.8* 12.7*  HCT 41.5 39.1 36.6* 38.2* 38.3*  MCV 90.4 89.3 90.1 89.9 89.9  PLT 126* 144* 149* 148* 137*    Cardiac Enzymes:  Recent Labs Lab 12/01/12 1550  TROPONINI <0.30   CBG:  Recent Labs Lab 12/04/12 0733 12/04/12 1147 12/04/12 1638 12/05/12 0720 12/05/12 1156  GLUCAP 119* 128* 213* 94 93    Recent Results (from the past 240 hour(s))  MRSA PCR SCREENING     Status: None   Collection Time    12/01/12  9:49 PM      Result Value Range Status   MRSA by PCR NEGATIVE  NEGATIVE Final   Comment:            The GeneXpert MRSA Assay (FDA     approved for NASAL specimens     only), is one component of a     comprehensive MRSA colonization     surveillance program. It is not     intended to diagnose MRSA     infection nor to guide or     monitor treatment for     MRSA infections.  CULTURE, BLOOD (ROUTINE X 2)     Status: None   Collection Time    12/02/12  2:52 PM      Result Value Range Status   Specimen Description BLOOD RIGHT ARM   Final   Special Requests BOTTLES DRAWN AEROBIC AND ANAEROBIC 5CC   Final   Culture  Setup Time 12/03/2012 00:19   Final   Culture     Final   Value:        BLOOD CULTURE RECEIVED NO GROWTH TO DATE CULTURE WILL BE HELD FOR 5 DAYS BEFORE ISSUING A FINAL NEGATIVE REPORT   Report Status PENDING   Incomplete  CULTURE, BLOOD (ROUTINE X 2)     Status: None   Collection Time    12/02/12  2:55 PM      Result Value Range Status   Specimen Description BLOOD RIGHT HAND   Final   Special Requests BOTTLES DRAWN AEROBIC AND ANAEROBIC 5CC   Final   Culture  Setup Time 12/03/2012 00:19   Final   Culture     Final   Value:        BLOOD CULTURE RECEIVED NO GROWTH TO DATE CULTURE WILL BE HELD FOR 5 DAYS BEFORE ISSUING A FINAL NEGATIVE REPORT   Report Status PENDING   Incomplete     Scheduled Meds: . ipratropium  0.5 mg Nebulization Q4H   And  . albuterol  2.5 mg Nebulization Q4H  . antiseptic oral rinse  15 mL Mouth Rinse q12n4p  . benzonatate  200 mg Oral TID  . budesonide (PULMICORT) nebulizer solution  0.5 mg Nebulization BID  . chlorhexidine  15  mL Mouth Rinse BID  . docusate sodium  100 mg Oral BID  . doxepin  50 mg Oral QHS  . enoxaparin (LOVENOX) injection  40 mg Subcutaneous Q24H  . fluticasone  2 spray Each Nare Daily  . insulin aspart  0-9 Units Subcutaneous TID WC  . levofloxacin  750 mg Oral Q24H  . oseltamivir  75 mg Oral BID  . pantoprazole  40 mg Oral BID AC  . predniSONE  50 mg Oral Q breakfast  . senna  1 tablet Oral BID  . sertraline  100 mg Oral Daily  . sodium chloride  3 mL Intravenous Q12H  . zolpidem  10 mg Oral QHS   Continuous Infusions:    Debbora Presto, MD  [facility] Pager [phone]  If 7PM-7AM, please contact night-coverage [email or web address] Password [email or web address] 12/05/2012, 2:42 PM   LOS: 4 days

## 2012-12-06 LAB — BASIC METABOLIC PANEL
CO2: 31 mEq/L (ref 19–32)
Chloride: 97 mEq/L (ref 96–112)
GFR calc Af Amer: >90 mL/min (ref >90)
Sodium: 135 mEq/L (ref 135–145)

## 2012-12-06 LAB — CBC
Platelets: 166 10*3/uL (ref 150–400)
RBC: 4.38 MIL/uL (ref 4.22–5.81)
RDW: 15 % (ref 11.5–15.5)
WBC: 8.9 10*3/uL (ref 4.0–10.5)

## 2012-12-06 MED ORDER — IPRATROPIUM BROMIDE 0.02 % IN SOLN
0.5000 mg | Freq: Four times a day (QID) | RESPIRATORY_TRACT | Status: DC
  Administered 2012-12-06: 0.5 mg via RESPIRATORY_TRACT
  Filled 2012-12-06: qty 2.5

## 2012-12-06 MED ORDER — GUAIFENESIN-DM 100-10 MG/5ML PO SYRP: 5.0000 mL | ORAL_SOLUTION | ORAL | Status: DC | PRN

## 2012-12-06 MED ORDER — LEVOFLOXACIN 750 MG PO TABS: 750.0000 mg | ORAL_TABLET | ORAL | Status: DC

## 2012-12-06 MED ORDER — ALBUTEROL SULFATE (5 MG/ML) 0.5% IN NEBU
2.5000 mg | INHALATION_SOLUTION | Freq: Four times a day (QID) | RESPIRATORY_TRACT | Status: DC
  Administered 2012-12-06: 2.5 mg via RESPIRATORY_TRACT
  Filled 2012-12-06: qty 0.5

## 2012-12-06 MED ORDER — PREDNISONE 50 MG PO TABS: ORAL_TABLET | ORAL | Status: DC

## 2012-12-06 MED ORDER — ALBUTEROL SULFATE (5 MG/ML) 0.5% IN NEBU: 2.5000 mg | INHALATION_SOLUTION | RESPIRATORY_TRACT | Status: DC | PRN

## 2012-12-06 MED ORDER — OSELTAMIVIR PHOSPHATE 75 MG PO CAPS: 75.0000 mg | ORAL_CAPSULE | Freq: Two times a day (BID) | ORAL | Status: DC

## 2012-12-06 NOTE — Progress Notes (Signed)
Pt left hospital at 1045 this morning with his nephew by his side. Discharge instructions/prescriptions given/explained with pt and nephew acknowledging understanding. Pt to go to [place] in [place], [place] for filling of new prescriptions. Followup appointment noted. Pt alert, oriented, and without c/o. Per MD gave one dose of Tamiflu and one dose of Levaquin to pt to take home for taking tonight prior to filling of prescriptions tomorrow.

## 2012-12-06 NOTE — Discharge Summary (Signed)
Physician Discharge Summary  [name] [name] [id number] DOB: 12/10/1945 DOA: 12/01/2012  PCP: No primary provider on file.  Admit date: 12/01/2012 Discharge date: 12/06/2012  Recommendations for Outpatient Follow-up:  1. Pt will need to follow up with PCP in 2-3 weeks post discharge 2. Please obtain BMP to evaluate electrolytes and kidney function 3. Please also check CBC to evaluate Hg and Hct levels  Discharge Diagnoses: COPD exacerbation, H1N1 flu Active Problems:   COPD (chronic obstructive pulmonary disease)   PTSD (post-traumatic stress disorder)   Acute respiratory failure with hypoxia   Tobacco abuse   Thrombocytopenia   Sinus tachycardia   Borderline diabetes  Discharge Condition: Stable  Diet recommendation: Heart healthy diet discussed in details   Brief narrative:  The patient is a 67 yo male with history of COPD and PTSD who presented to [facility] ED with main concern of progressively worsening shortness of breath that initially started one month prior to admission. Patient describes shortness of breath that initially exertional only an over the past week present at rest. This has been associated with subjective fevers and chills, nausea.   In the ED, CXR was c/w emphysema. He was started on bipap for tachypnea and respiratory distress. He has initially required BiPAP and was able to transition to 2L nasal cannula after about 30 minutes. Pt was given albuterol nebulizer, solumedrol, Levaquin in ED.   Active Problems:  Acute respiratory failure with hypoxia  - this is mostly multifactorial in etiology and secondary acute COPD exacerbation, flu  - Influenza panel positive for H1N1  - pt clinically improving, no wheezing on exam this AM  - pt will continue Tamiflu and Levaquin post discharge  - nebulizers as needed, continue Prednisone taper pack upon discharge  COPD (chronic obstructive pulmonary disease)  - clinically improving and maintaining oxygen saturations at target  range  - continued supportive care as noted above and pt has responded well PTSD (post-traumatic stress disorder)  - stable clinically  Tobacco abuse  - discussed cessation  Sinus tachycardia  - now stable and HR is within normal limits  Borderline diabetes  - reasonable control while inpatient   Consultants:  None Procedures/Studies:   Dg Chest Port 1 View 12/01/2012 --> Hyperexpansion compatible with emphysema. No acute abnormality.  Antibiotics:  Levaquin 02/04 --> 5 more days post discharge Tamiflu 02/05 --> 4 more days post discharge   Code Status: Full  Family Communication: Pt at bedside   Discharge Exam: Filed Vitals:   12/06/12 0604  BP: 159/82  Pulse: 86  Temp: 97.9 F (36.6 C)  Resp: 20   Filed Vitals:   12/06/12 0039 12/06/12 0251 12/06/12 0604 12/06/12 0809  BP: 170/90  159/82   Pulse:   86   Temp:   97.9 F (36.6 C)   TempSrc:   Oral   Resp:   20   Height:      Weight:      SpO2:  95% 97% 94%    General: Pt is alert, follows commands appropriately, not in acute distress Cardiovascular: Regular rate and rhythm, S1/S2 +, no murmurs, no rubs, no gallops Respiratory: Clear to auscultation bilaterally, no wheezing, no crackles, no rhonchi Abdominal: Soft, non tender, non distended, bowel sounds +, no guarding Extremities: no edema, no cyanosis, pulses palpable bilaterally DP and PT Neuro: Grossly nonfocal  Discharge Instructions  Discharge Orders   Future Orders Complete By Expires     Diet - low sodium heart healthy  As directed  Increase activity slowly  As directed         Medication List    TAKE these medications       albuterol 108 (90 BASE) MCG/ACT inhaler  Commonly known as:  PROVENTIL HFA;VENTOLIN HFA  Inhale 2 puffs into the lungs every 6 (six) hours as needed.     albuterol (5 MG/ML) 0.5% nebulizer solution  Commonly known as:  PROVENTIL  Take 0.5 mLs (2.5 mg total) by nebulization every 2 (two) hours as needed for wheezing or  shortness of breath.     budesonide-formoterol 160-4.5 MCG/ACT inhaler  Commonly known as:  SYMBICORT  Inhale 2 puffs into the lungs 2 (two) times daily.     doxepin 50 MG capsule  Commonly known as:  SINEQUAN  Take 50 mg by mouth at bedtime.     guaiFENesin-dextromethorphan 100-10 MG/5ML syrup  Commonly known as:  ROBITUSSIN DM  Take 5 mLs by mouth every 4 (four) hours as needed for cough.     levofloxacin 750 MG tablet  Commonly known as:  LEVAQUIN  Take 1 tablet (750 mg total) by mouth daily.     oseltamivir 75 MG capsule  Commonly known as:  TAMIFLU  Take 1 capsule (75 mg total) by mouth 2 (two) times daily.     predniSONE 50 MG tablet  Commonly known as:  DELTASONE  Take 50 mg tablet today and taper down by 10 mg daily until completed, call [phone] with questions (Dr. Izola Price)     sertraline 100 MG tablet  Commonly known as:  ZOLOFT  Take 100 mg by mouth daily.     zolpidem 10 MG tablet  Commonly known as:  AMBIEN  Take 10 mg by mouth at bedtime.           Follow-up Information   Please follow up. (As needed if symptoms worsen)        The results of significant diagnostics from this hospitalization (including imaging, microbiology, ancillary and laboratory) are listed below for reference.     Microbiology: Recent Results (from the past 240 hour(s))  MRSA PCR SCREENING     Status: None   Collection Time    12/01/12  9:49 PM      Result Value Range Status   MRSA by PCR NEGATIVE  NEGATIVE Final   Comment:            The GeneXpert MRSA Assay (FDA     approved for NASAL specimens     only), is one component of a     comprehensive MRSA colonization     surveillance program. It is not     intended to diagnose MRSA     infection nor to guide or     monitor treatment for     MRSA infections.  CULTURE, BLOOD (ROUTINE X 2)     Status: None   Collection Time    12/02/12  2:52 PM      Result Value Range Status   Specimen Description BLOOD RIGHT ARM    Final   Special Requests BOTTLES DRAWN AEROBIC AND ANAEROBIC 5CC   Final   Culture  Setup Time 12/03/2012 00:19   Final   Culture     Final   Value:        BLOOD CULTURE RECEIVED NO GROWTH TO DATE CULTURE WILL BE HELD FOR 5 DAYS BEFORE ISSUING A FINAL NEGATIVE REPORT   Report Status PENDING   Incomplete  CULTURE, BLOOD (ROUTINE X 2)  Status: None   Collection Time    12/02/12  2:55 PM      Result Value Range Status   Specimen Description BLOOD RIGHT HAND   Final   Special Requests BOTTLES DRAWN AEROBIC AND ANAEROBIC 5CC   Final   Culture  Setup Time 12/03/2012 00:19   Final   Culture     Final   Value:        BLOOD CULTURE RECEIVED NO GROWTH TO DATE CULTURE WILL BE HELD FOR 5 DAYS BEFORE ISSUING A FINAL NEGATIVE REPORT   Report Status PENDING   Incomplete     Labs: Basic Metabolic Panel:  Recent Labs Lab 12/02/12 0335 12/03/12 0330 12/04/12 0438 12/05/12 0449 12/06/12 0433  NA 135 136 136 139 135  K 3.7 4.0 3.6 3.2* 3.5  CL 97 99 97 98 97  CO2 27 27 30  32 31  GLUCOSE 142* 165* 134* 108* 107*  BUN 14 16 16 15 12   CREATININE 0.84 0.86 0.75 0.79 0.80  CALCIUM 8.7 8.7 9.0 8.5 8.2*   Liver Function Tests:  Recent Labs Lab 12/01/12 1550  AST 28  ALT 16  ALKPHOS 70  BILITOT 0.8  PROT 7.7  ALBUMIN 3.7   CBC:  Recent Labs Lab 12/01/12 1630 12/02/12 0335 12/03/12 0330 12/04/12 0438 12/05/12 0449 12/06/12 0433  WBC 10.4 6.3 11.4* 8.8 7.4 8.9  NEUTROABS 9.9*  --   --   --   --   --   HGB 13.9 13.3 12.2* 12.8* 12.7* 13.2  HCT 41.5 39.1 36.6* 38.2* 38.3* 38.8*  MCV 90.4 89.3 90.1 89.9 89.9 88.6  PLT 126* 144* 149* 148* 137* 166   Cardiac Enzymes:  Recent Labs Lab 12/01/12 1550  TROPONINI <0.30   BNP: BNP (last 3 results)  Recent Labs  12/01/12 1550  PROBNP 456.5*   CBG:  Recent Labs Lab 12/04/12 1638 12/05/12 0720 12/05/12 1156 12/05/12 1703 12/06/12 0811  GLUCAP 213* 94 93 169* 98     SIGNED: Time coordinating discharge: Over 30  minutes  MAGICK-Sheree Lalla, MD  [facility] 12/06/2012, 8:25 AM Pager [phone]  If 7PM-7AM, please contact night-coverage [email or web address] Password [email or web address]

## 2012-12-09 LAB — CULTURE, BLOOD (ROUTINE X 2): Culture: NO GROWTH

## 2012-12-21 ENCOUNTER — Encounter (HOSPITAL_COMMUNITY): Payer: Self-pay | Admitting: Emergency Medicine

## 2012-12-21 ENCOUNTER — Inpatient Hospital Stay (HOSPITAL_COMMUNITY)
Admission: EM | Admit: 2012-12-21 | Discharge: 2012-12-26 | DRG: 191 | Disposition: A | Discharge status: Home / Self Care | Payer: Medicare Other | Attending: Internal Medicine | Admitting: Internal Medicine

## 2012-12-21 ENCOUNTER — Emergency Department (HOSPITAL_COMMUNITY): Payer: Medicare Other

## 2012-12-21 DIAGNOSIS — L02419 Cutaneous abscess of limb, unspecified: Secondary | ICD-10-CM

## 2012-12-21 DIAGNOSIS — I10 Essential (primary) hypertension: Secondary | ICD-10-CM

## 2012-12-21 DIAGNOSIS — B9562 Methicillin resistant Staphylococcus aureus infection as the cause of diseases classified elsewhere: Secondary | ICD-10-CM

## 2012-12-21 DIAGNOSIS — R7309 Other abnormal glucose: Secondary | ICD-10-CM | POA: Diagnosis present

## 2012-12-21 DIAGNOSIS — R7303 Prediabetes: Secondary | ICD-10-CM

## 2012-12-21 DIAGNOSIS — L0390 Cellulitis, unspecified: Secondary | ICD-10-CM

## 2012-12-21 DIAGNOSIS — A419 Sepsis, unspecified organism: Secondary | ICD-10-CM

## 2012-12-21 DIAGNOSIS — J9601 Acute respiratory failure with hypoxia: Secondary | ICD-10-CM

## 2012-12-21 DIAGNOSIS — R030 Elevated blood-pressure reading, without diagnosis of hypertension: Secondary | ICD-10-CM | POA: Diagnosis present

## 2012-12-21 DIAGNOSIS — T380X5A Adverse effect of glucocorticoids and synthetic analogues, initial encounter: Secondary | ICD-10-CM | POA: Diagnosis present

## 2012-12-21 DIAGNOSIS — E871 Hypo-osmolality and hyponatremia: Secondary | ICD-10-CM | POA: Diagnosis present

## 2012-12-21 DIAGNOSIS — D696 Thrombocytopenia, unspecified: Secondary | ICD-10-CM

## 2012-12-21 DIAGNOSIS — F4310 Post-traumatic stress disorder, unspecified: Secondary | ICD-10-CM | POA: Diagnosis present

## 2012-12-21 DIAGNOSIS — R000 Tachycardia, unspecified: Secondary | ICD-10-CM

## 2012-12-21 DIAGNOSIS — Z87891 Personal history of nicotine dependence: Secondary | ICD-10-CM

## 2012-12-21 DIAGNOSIS — L0291 Cutaneous abscess, unspecified: Secondary | ICD-10-CM

## 2012-12-21 DIAGNOSIS — Z79899 Other long term (current) drug therapy: Secondary | ICD-10-CM

## 2012-12-21 DIAGNOSIS — J449 Chronic obstructive pulmonary disease, unspecified: Secondary | ICD-10-CM | POA: Diagnosis present

## 2012-12-21 DIAGNOSIS — A4902 Methicillin resistant Staphylococcus aureus infection, unspecified site: Secondary | ICD-10-CM | POA: Diagnosis present

## 2012-12-21 DIAGNOSIS — Z720 Tobacco use: Secondary | ICD-10-CM

## 2012-12-21 DIAGNOSIS — J441 Chronic obstructive pulmonary disease with (acute) exacerbation: Secondary | ICD-10-CM

## 2012-12-21 DIAGNOSIS — IMO0002 Reserved for concepts with insufficient information to code with codable children: Secondary | ICD-10-CM

## 2012-12-21 HISTORY — DX: Pneumonia, unspecified organism: J18.9

## 2012-12-21 LAB — COMPREHENSIVE METABOLIC PANEL
ALT: 11 U/L (ref 0–53)
AST: 8 U/L (ref 0–37)
Albumin: 2.7 g/dL — ABNORMAL LOW (ref 3.5–5.2)
Alkaline Phosphatase: 61 U/L (ref 39–117)
BUN: 12 mg/dL (ref 6–23)
CO2: 24 mEq/L (ref 19–32)
Calcium: 8.1 mg/dL — ABNORMAL LOW (ref 8.4–10.5)
Chloride: 93 mEq/L — ABNORMAL LOW (ref 96–112)
Creatinine, Ser: 0.86 mg/dL (ref 0.50–1.35)
GFR calc Af Amer: >90 mL/min (ref >90)
GFR calc non Af Amer: 88 mL/min — ABNORMAL LOW (ref >90)
Glucose, Bld: 96 mg/dL (ref 70–99)
Potassium: 3.5 mEq/L (ref 3.5–5.1)
Sodium: 128 mEq/L — ABNORMAL LOW (ref 135–145)
Total Bilirubin: 1.5 mg/dL — ABNORMAL HIGH (ref 0.3–1.2)
Total Protein: 6.8 g/dL (ref 6.0–8.3)

## 2012-12-21 LAB — CBC WITH DIFFERENTIAL/PLATELET
Basophils Absolute: 0 10*3/uL (ref 0.0–0.1)
Basophils Relative: 0 % (ref 0–1)
Eosinophils Absolute: 0 10*3/uL (ref 0.0–0.7)
Eosinophils Relative: 0 % (ref 0–5)
HCT: 37.1 % — ABNORMAL LOW (ref 39.0–52.0)
Hemoglobin: 12.9 g/dL — ABNORMAL LOW (ref 13.0–17.0)
Lymphocytes Relative: 10 % — ABNORMAL LOW (ref 12–46)
Lymphs Abs: 1.7 10*3/uL (ref 0.7–4.0)
MCH: 29.3 pg (ref 26.0–34.0)
MCHC: 34.8 g/dL (ref 30.0–36.0)
MCV: 84.3 fL (ref 78.0–100.0)
Monocytes Absolute: 0.8 10*3/uL (ref 0.1–1.0)
Monocytes Relative: 5 % (ref 3–12)
Neutro Abs: 14.9 10*3/uL — ABNORMAL HIGH (ref 1.7–7.7)
Neutrophils Relative %: 85 % — ABNORMAL HIGH (ref 43–77)
Platelets: 193 10*3/uL (ref 150–400)
RBC: 4.4 MIL/uL (ref 4.22–5.81)
RDW: 15.1 % (ref 11.5–15.5)
WBC: 17.4 10*3/uL — ABNORMAL HIGH (ref 4.0–10.5)

## 2012-12-21 LAB — LACTIC ACID, PLASMA: Lactic Acid, Venous: 0.7 mmol/L (ref 0.5–2.2)

## 2012-12-21 LAB — TROPONIN I: Troponin I: <0.3 ng/mL (ref <0.30)

## 2012-12-21 MED ORDER — BUPIVACAINE HCL 0.25 % IJ SOLN
5.0000 mL | Freq: Once | INTRAMUSCULAR | Status: DC
Start: 2012-12-21 — End: 2012-12-22
  Filled 2012-12-21: qty 5

## 2012-12-21 MED ORDER — BUPIVACAINE HCL (PF) 0.5 % IJ SOLN
INTRAMUSCULAR | Status: AC
  Filled 2012-12-21: qty 30

## 2012-12-21 MED ORDER — SODIUM CHLORIDE 0.9 % IV BOLUS (SEPSIS): 1000.0000 mL | Freq: Once | INTRAVENOUS | Status: DC

## 2012-12-21 MED ORDER — ONDANSETRON HCL 4 MG/2ML IJ SOLN
4.0000 mg | Freq: Once | INTRAMUSCULAR | Status: AC
  Administered 2012-12-21: 4 mg via INTRAVENOUS
  Filled 2012-12-21: qty 2

## 2012-12-21 MED ORDER — VANCOMYCIN HCL IN DEXTROSE 1-5 GM/200ML-% IV SOLN
1000.0000 mg | Freq: Once | INTRAVENOUS | Status: AC
  Administered 2012-12-21: 1000 mg via INTRAVENOUS
  Filled 2012-12-21: qty 200

## 2012-12-21 MED ORDER — ACETAMINOPHEN 325 MG PO TABS
650.0000 mg | ORAL_TABLET | Freq: Once | ORAL | Status: AC
  Administered 2012-12-21: 650 mg via ORAL
  Filled 2012-12-21: qty 2

## 2012-12-21 MED ORDER — MORPHINE SULFATE 4 MG/ML IJ SOLN
4.0000 mg | Freq: Once | INTRAMUSCULAR | Status: AC
  Administered 2012-12-21: 4 mg via INTRAVENOUS
  Filled 2012-12-21: qty 1

## 2012-12-21 NOTE — ED Notes (Signed)
Per EMS - Pt recently hospitalized for pneumonia and apparently developed a staph infection while he was here to left axillary region. Pt continues to have flu like Sx, shortness of breath, and has red, hot to touch area to left chest wall. BP - 148/88, HR - 118, Resp - 24, O2 SAT - 98% on RA. Appetite has remained poor since discharge and overall health has deteriorated since discharge.

## 2012-12-21 NOTE — ED Notes (Signed)
ZOX:[id number]<AV> Expected date:12/21/12<BR> Expected time: 9:19 PM<BR> Means of arrival:Ambulance<BR> Comments:<BR> Shortness of breath

## 2012-12-21 NOTE — ED Provider Notes (Signed)
History    67 year old male with left axillary and left anterior chest pain. Patient states that about a week ago he noticed a "pimple" in his left axillary region. Has since progressively become larger in size and more painful. Some spontaneous drainage. He also notes two other similar lesions in the same area which he popped himself and have improved. Subjective fever in the past day. Feels generally weak. Admitted about 3 weeks ago for COPD exacerbation/influenza.    CSN: [id number]  Arrival date & time 12/21/12  2131   First MD Initiated Contact with Patient 12/21/12 2201      Chief Complaint  Patient presents with  . Shortness of Breath    (Consider location/radiation/quality/duration/timing/severity/associated sxs/prior treatment) HPI  Past Medical History  Diagnosis Date  . COPD (chronic obstructive pulmonary disease)   . Asthma   . Emphysema   . PTSD (post-traumatic stress disorder)   . Tobacco abuse   . Pneumonia     Past Surgical History  Procedure Laterality Date  . Hernia repair      Family History  Problem Relation Age of Onset  . Asthma Cousin   . Asthma Cousin   . Diabetes Brother   . Diabetes Mother     History  Substance Use Topics  . Smoking status: Current Every Day Smoker -- 2.00 packs/day for 55 years    Types: Cigarettes  . Smokeless tobacco: Never Used  . Alcohol Use: Yes     Comment: occasion      Review of Systems  All systems reviewed and negative, other than as noted in HPI.   Allergies  Review of patient's allergies indicates no known allergies.  Home Medications   Current Outpatient Rx  Name  Route  Sig  Dispense  Refill  . albuterol (PROVENTIL HFA;VENTOLIN HFA) 108 (90 BASE) MCG/ACT inhaler   Inhalation   Inhale 2 puffs into the lungs every 6 (six) hours as needed.         Marland Kitchen albuterol (PROVENTIL) (5 MG/ML) 0.5% nebulizer solution   Nebulization   Take 0.5 mLs (2.5 mg total) by nebulization every 2 (two) hours as  needed for wheezing or shortness of breath.   20 mL   1   . budesonide-formoterol (SYMBICORT) 160-4.5 MCG/ACT inhaler   Inhalation   Inhale 2 puffs into the lungs 2 (two) times daily.         Marland Kitchen doxepin (SINEQUAN) 50 MG capsule   Oral   Take 50 mg by mouth at bedtime.         Marland Kitchen guaiFENesin-dextromethorphan (ROBITUSSIN DM) 100-10 MG/5ML syrup   Oral   Take 5 mLs by mouth every 4 (four) hours as needed for cough.   118 mL   1   . sertraline (ZOLOFT) 100 MG tablet   Oral   Take 100 mg by mouth daily.         Marland Kitchen tiotropium (SPIRIVA) 18 MCG inhalation capsule   Inhalation   Place 18 mcg into inhaler and inhale daily.         Marland Kitchen zolpidem (AMBIEN) 10 MG tablet   Oral   Take 10 mg by mouth at bedtime.           BP 118/80  Pulse 118  Temp(Src) 99.7 F (37.6 C) (Oral)  Resp 19  SpO2 97%  Physical Exam  Nursing note and vitals reviewed. Constitutional: He appears well-developed and well-nourished.  HENT:  Head: Normocephalic and atraumatic.  Eyes: Conjunctivae are normal. Right  eye exhibits no discharge. Left eye exhibits no discharge.  Neck: Neck supple.  Cardiovascular: Regular rhythm and normal heart sounds.  Exam reveals no gallop and no friction rub.   No murmur heard. Tachycardic  Pulmonary/Chest: Effort normal and breath sounds normal. No respiratory distress.  Abdominal: Soft. He exhibits no distension. There is no tenderness.  Well-healed laparotomy scar. Tenderness. No distention.  Musculoskeletal: He exhibits no edema and no tenderness.  Neurological: He is alert.  Skin:  Spontaneously draining abscess to left axillary region with cellulitis extending medially across the left anterior chest wall.  Psychiatric: He has a normal mood and affect. His behavior is normal. Thought content normal.    ED Course  Procedures (including critical care time)  INCISION AND DRAINAGE Performed by: Raeford Razor Consent: Verbal consent obtained. Risks and  benefits: risks, benefits and alternatives were discussed Type: abscess  Body area: L axilla  Anesthesia: local infiltration  Incision was made with a scalpel.  Local anesthetic: 0.25% bupivicaine w/o epi  Anesthetic total: 3 ml  Complexity: complex  Blunt dissection to break up loculations  Drainage: purulent  Drainage amount: large  Packing material: none  Patient tolerance: Patient tolerated the procedure well with no immediate complications.     Labs Reviewed  CBC WITH DIFFERENTIAL - Abnormal; Notable for the following:    WBC 17.4 (*)    Hemoglobin 12.9 (*)    HCT 37.1 (*)    Neutrophils Relative 85 (*)    Neutro Abs 14.9 (*)    Lymphocytes Relative 10 (*)    All other components within normal limits  COMPREHENSIVE METABOLIC PANEL - Abnormal; Notable for the following:    Sodium 128 (*)    Chloride 93 (*)    Calcium 8.1 (*)    Albumin 2.7 (*)    Total Bilirubin 1.5 (*)    GFR calc non Af Amer 88 (*)    All other components within normal limits  PRO B NATRIURETIC PEPTIDE - Abnormal; Notable for the following:    Pro B Natriuretic peptide (BNP) 415.7 (*)    All other components within normal limits  CULTURE, BLOOD (ROUTINE X 2)  CULTURE, BLOOD (ROUTINE X 2)  LACTIC ACID, PLASMA  TROPONIN I   Dg Chest Portable 1 View  12/21/2012  *RADIOLOGY REPORT*  Clinical Data: Shortness of breath.  PORTABLE CHEST - 1 VIEW  Comparison: 12/01/2012  Findings: Normal heart size and pulmonary vascularity. Emphysematous changes and scattered fibrosis in the lungs.  No focal consolidation or airspace disease.  No blunting of costophrenic angles.  No pneumothorax.  Mediastinal contours appear intact.  Calcification of the aorta.  No significant change since previous study.  IMPRESSION: Emphysematous changes in the lungs.  No evidence of active pulmonary disease.   Original Report Authenticated By: Burman Nieves, M.D.    EKG:  Rhythm: sinus tachcyardia Vent. rate 117 BPM PR  interval 128 ms QRS duration 92 ms QT/QTc 320/446 ms Poor r wave progression ST segments: NS ST changes   1. Sepsis   2. Abscess or cellulitis of chest wall   3. Hyponatremia       MDM  66yM with large L axillary abscess and L chest wall cellulitis. Tachycardic, subjective fever and leukocytosis. Hopefully should improve quickly now that I&D'd, but given degree of symptoms, I feel he should be admitted for abx and further monitoring.         Raeford Razor, MD 12/21/12 2312

## 2012-12-22 ENCOUNTER — Encounter (HOSPITAL_COMMUNITY): Payer: Self-pay | Admitting: Internal Medicine

## 2012-12-22 DIAGNOSIS — B9562 Methicillin resistant Staphylococcus aureus infection as the cause of diseases classified elsewhere: Secondary | ICD-10-CM | POA: Diagnosis present

## 2012-12-22 DIAGNOSIS — E871 Hypo-osmolality and hyponatremia: Secondary | ICD-10-CM

## 2012-12-22 DIAGNOSIS — L02419 Cutaneous abscess of limb, unspecified: Secondary | ICD-10-CM

## 2012-12-22 DIAGNOSIS — J441 Chronic obstructive pulmonary disease with (acute) exacerbation: Secondary | ICD-10-CM

## 2012-12-22 HISTORY — DX: Methicillin resistant Staphylococcus aureus infection as the cause of diseases classified elsewhere: B95.62

## 2012-12-22 LAB — BASIC METABOLIC PANEL
BUN: 14 mg/dL (ref 6–23)
Calcium: 8.7 mg/dL (ref 8.4–10.5)
Chloride: 92 mEq/L — ABNORMAL LOW (ref 96–112)
Creatinine, Ser: 0.99 mg/dL (ref 0.50–1.35)
GFR calc Af Amer: >90 mL/min (ref >90)

## 2012-12-22 LAB — CBC
HCT: 35.4 % — ABNORMAL LOW (ref 39.0–52.0)
MCHC: 34.5 g/dL (ref 30.0–36.0)
MCV: 85.1 fL (ref 78.0–100.0)
Platelets: 174 10*3/uL (ref 150–400)
RDW: 15.3 % (ref 11.5–15.5)
WBC: 14 10*3/uL — ABNORMAL HIGH (ref 4.0–10.5)

## 2012-12-22 MED ORDER — METHYLPREDNISOLONE SODIUM SUCC 40 MG IJ SOLR
40.0000 mg | INTRAMUSCULAR | Status: DC
  Administered 2012-12-22: 40 mg via INTRAVENOUS
  Filled 2012-12-22 (×2): qty 1

## 2012-12-22 MED ORDER — IPRATROPIUM BROMIDE 0.02 % IN SOLN
0.5000 mg | Freq: Four times a day (QID) | RESPIRATORY_TRACT | Status: DC
  Administered 2012-12-22 – 2012-12-26 (×16): 0.5 mg via RESPIRATORY_TRACT
  Filled 2012-12-22 (×19): qty 2.5

## 2012-12-22 MED ORDER — ZOLPIDEM TARTRATE 10 MG PO TABS: 10.0000 mg | ORAL_TABLET | Freq: Every day | ORAL | Status: DC

## 2012-12-22 MED ORDER — INSULIN ASPART 100 UNIT/ML ~~LOC~~ SOLN
0.0000 [IU] | Freq: Three times a day (TID) | SUBCUTANEOUS | Status: DC
  Administered 2012-12-22 (×2): 2 [IU] via SUBCUTANEOUS
  Administered 2012-12-23: 09:00:00 via SUBCUTANEOUS
  Administered 2012-12-23 – 2012-12-24 (×3): 2 [IU] via SUBCUTANEOUS

## 2012-12-22 MED ORDER — ONDANSETRON HCL 4 MG PO TABS: 4.0000 mg | ORAL_TABLET | Freq: Four times a day (QID) | ORAL | Status: DC | PRN

## 2012-12-22 MED ORDER — ZOLPIDEM TARTRATE 5 MG PO TABS
5.0000 mg | ORAL_TABLET | Freq: Every day | ORAL | Status: DC
  Administered 2012-12-22 – 2012-12-25 (×4): 5 mg via ORAL
  Filled 2012-12-22 (×4): qty 1

## 2012-12-22 MED ORDER — METHYLPREDNISOLONE SODIUM SUCC 125 MG IJ SOLR
60.0000 mg | Freq: Four times a day (QID) | INTRAMUSCULAR | Status: DC
  Administered 2012-12-22 – 2012-12-23 (×4): 60 mg via INTRAVENOUS
  Filled 2012-12-22 (×8): qty 0.96

## 2012-12-22 MED ORDER — BIOTENE DRY MOUTH MT LIQD
15.0000 mL | Freq: Two times a day (BID) | OROMUCOSAL | Status: DC
  Administered 2012-12-22 – 2012-12-26 (×5): 15 mL via OROMUCOSAL

## 2012-12-22 MED ORDER — LEVALBUTEROL HCL 0.63 MG/3ML IN NEBU
0.6300 mg | INHALATION_SOLUTION | Freq: Four times a day (QID) | RESPIRATORY_TRACT | Status: DC
  Administered 2012-12-22 – 2012-12-24 (×10): 0.63 mg via RESPIRATORY_TRACT
  Filled 2012-12-22 (×15): qty 3

## 2012-12-22 MED ORDER — LEVALBUTEROL HCL 0.63 MG/3ML IN NEBU
0.6300 mg | INHALATION_SOLUTION | Freq: Four times a day (QID) | RESPIRATORY_TRACT | Status: DC | PRN
  Filled 2012-12-22: qty 3

## 2012-12-22 MED ORDER — PIPERACILLIN-TAZOBACTAM 3.375 G IVPB
3.3750 g | Freq: Three times a day (TID) | INTRAVENOUS | Status: DC
  Administered 2012-12-22 – 2012-12-24 (×7): 3.375 g via INTRAVENOUS
  Filled 2012-12-22 (×8): qty 50

## 2012-12-22 MED ORDER — ACETAMINOPHEN 650 MG RE SUPP: 650.0000 mg | Freq: Four times a day (QID) | RECTAL | Status: DC | PRN

## 2012-12-22 MED ORDER — SERTRALINE HCL 100 MG PO TABS
100.0000 mg | ORAL_TABLET | Freq: Every day | ORAL | Status: DC
Start: 2012-12-22 — End: 2012-12-26
  Administered 2012-12-22 – 2012-12-26 (×5): 100 mg via ORAL
  Filled 2012-12-22 (×5): qty 1

## 2012-12-22 MED ORDER — ACETAMINOPHEN 325 MG PO TABS: 650.0000 mg | ORAL_TABLET | Freq: Four times a day (QID) | ORAL | Status: DC | PRN

## 2012-12-22 MED ORDER — SODIUM CHLORIDE 0.9 % IV SOLN
INTRAVENOUS | Status: AC
  Administered 2012-12-22 (×3): via INTRAVENOUS

## 2012-12-22 MED ORDER — ENOXAPARIN SODIUM 40 MG/0.4ML ~~LOC~~ SOLN
40.0000 mg | SUBCUTANEOUS | Status: DC
  Administered 2012-12-22 – 2012-12-26 (×5): 40 mg via SUBCUTANEOUS
  Filled 2012-12-22 (×6): qty 0.4

## 2012-12-22 MED ORDER — DOXEPIN HCL 50 MG PO CAPS
50.0000 mg | ORAL_CAPSULE | Freq: Every day | ORAL | Status: DC
  Administered 2012-12-22 – 2012-12-25 (×4): 50 mg via ORAL
  Filled 2012-12-22 (×5): qty 1

## 2012-12-22 MED ORDER — SODIUM CHLORIDE 0.9 % IJ SOLN
3.0000 mL | Freq: Two times a day (BID) | INTRAMUSCULAR | Status: DC
  Administered 2012-12-22 – 2012-12-23 (×3): 3 mL via INTRAVENOUS

## 2012-12-22 MED ORDER — VANCOMYCIN HCL IN DEXTROSE 1-5 GM/200ML-% IV SOLN
1000.0000 mg | Freq: Two times a day (BID) | INTRAVENOUS | Status: DC
  Administered 2012-12-22 – 2012-12-26 (×9): 1000 mg via INTRAVENOUS
  Filled 2012-12-22 (×10): qty 200

## 2012-12-22 MED ORDER — BUDESONIDE 0.25 MG/2ML IN SUSP
0.2500 mg | Freq: Two times a day (BID) | RESPIRATORY_TRACT | Status: DC
  Administered 2012-12-22 – 2012-12-26 (×9): 0.25 mg via RESPIRATORY_TRACT
  Filled 2012-12-22 (×12): qty 2

## 2012-12-22 MED ORDER — ONDANSETRON HCL 4 MG/2ML IJ SOLN: 4.0000 mg | Freq: Four times a day (QID) | INTRAMUSCULAR | Status: DC | PRN

## 2012-12-22 MED ORDER — ONDANSETRON HCL 4 MG/2ML IJ SOLN: 4.0000 mg | Freq: Three times a day (TID) | INTRAMUSCULAR | Status: DC | PRN

## 2012-12-22 NOTE — Progress Notes (Signed)
   CARE MANAGEMENT NOTE 12/22/2012  Patient:  [name] [name]   Account Number:  [id number]  Date Initiated:  12/22/2012  Documentation initiated by:  Jiles Crocker  Subjective/Objective Assessment:   ADMITTED WITH CELLULITIS WITH ABSCESS     Action/Plan:   PCP IS Dr. Manfred Arch at the [facility]  LIVES ALONE; CM FOLLOWING FOR DCP   Anticipated DC Date:  12/26/2012   Anticipated DC Plan:  HOME/SELF CARE      DC Planning Services  CM consult  Status of service:  In process, will continue to follow Medicare Important Message given?  NA - LOS <3 / Initial given by admissions (If response is "NO", the following Medicare IM given date fields will be blank) Per UR Regulation:  Reviewed for med. necessity/level of care/duration of stay Comments:  12/22/2012- [name] [name] Rivet RN,BSN,MHA

## 2012-12-22 NOTE — Progress Notes (Signed)
[facility] PROGRESS NOTE  Tajuan Dufault GNF:[id number] DOB: [birth year] DOA: 12/21/2012 PCP: Provider Not In System  Assessment/Plan: Cellulitis/axillary abscess -Suspect MRSA -Continue empiric vancomycin and Zosyn -Wound culture obtained today -Still draining purulent material post bedside I&D in the emergency department -CT chest to clarify extension of infection -Followup blood cultures and narrow antibiotics COPD exacerbation -Increase frequency of Solu-Medrol as patient is still having significant wheezing -No respiratory distress -Over 100-pack-year tobacco; quit smoking in January Hyperglycemia -Steroid-induced -Hemoglobin A1c 4.9 -NovoLog sliding scale Hyponatremia -Likely volume depletion -Continue IV fluids -Check TSH PTSD -Continue home medications    Family Communication:   Pt at beside Disposition Plan:   Home when medically stable  Procedures:  Bedside I&D 12/22/2012>>>  Antibiotics:  Vancomycin/Zosyn 12/22/2012>>>        Procedures/Studies: Dg Chest Portable 1 View  12/21/2012  *RADIOLOGY REPORT*  Clinical Data: Shortness of breath.  PORTABLE CHEST - 1 VIEW  Comparison: 12/01/2012  Findings: Normal heart size and pulmonary vascularity. Emphysematous changes and scattered fibrosis in the lungs.  No focal consolidation or airspace disease.  No blunting of costophrenic angles.  No pneumothorax.  Mediastinal contours appear intact.  Calcification of the aorta.  No significant change since previous study.  IMPRESSION: Emphysematous changes in the lungs.  No evidence of active pulmonary disease.   Original Report Authenticated By: Burman Nieves, M.D.    Dg Chest Port 1 View  12/01/2012  *RADIOLOGY REPORT*  Clinical Data: Shortness of breath.  COPD.  PORTABLE CHEST - 1 VIEW  Comparison: PA and lateral chest 12/18/2008.  Findings: The chest is hyperexpanded but the lungs are clear. Heart size is normal.  No pneumothorax or pleural fluid. Surgical  clips at the gastroesophageal junction are again seen.  IMPRESSION: Hyperexpansion compatible with emphysema.  No acute abnormality.   Original Report Authenticated By: Holley Dexter, M.D.          Subjective: Patient is feeling better today. He denies any fevers, chills, chest pain, shortness breath, nausea, vomiting, diarrhea, abdominal pain, dysuria, hematuria  Objective: Filed Vitals:   12/22/12 0212 12/22/12 0539 12/22/12 0838 12/22/12 1401  BP: 137/82 130/72  123/70  Pulse: 94 93  95  Temp: 97.6 F (36.4 C) 97.8 F (36.6 C)  97.7 F (36.5 C)  TempSrc: Oral Oral  Oral  Resp: 20 20  20   Height: 5' 8.11" (1.73 m)     Weight: 76.8 kg (169 lb 5 oz)     SpO2: 99% 97% 95% 99%    Intake/Output Summary (Last 24 hours) at 12/22/12 1855 Last data filed at 12/22/12 1827  Gross per 24 hour  Intake 1336.67 ml  Output      0 ml  Net 1336.67 ml   Weight change:  Exam:   General:  Pt is alert, follows commands appropriately, not in acute distress  HEENT: No icterus, No thrush, [place]/AT  Cardiovascular: RRR, S1/S2, no rubs, no gallops  Respiratory: Bilateral expiratory wheeze. No rhonchi. Good air movement.  Abdomen: Soft/+BS, non tender, non distended, no guarding  Extremities: No edema, No lymphangitis, No petechiae, No rashes, no synovitis; left axilla with purulent drainage. Erythema and induration extends to the anterior axillary line--no crepitance--no necrosis  Data Reviewed: Basic Metabolic Panel:  Recent Labs Lab 12/21/12 2158 12/22/12 0233  NA 128* 128*  K 3.5 3.6  CL 93* 92*  CO2 24 27  GLUCOSE 96 111*  BUN 12 14  CREATININE 0.86 0.99  CALCIUM 8.1* 8.7   Liver Function Tests:  Recent Labs Lab 12/21/12 2158  AST 8  ALT 11  ALKPHOS 61  BILITOT 1.5*  PROT 6.8  ALBUMIN 2.7*   No results found for this basename: LIPASE, AMYLASE,  in the last 168 hours No results found for this basename: AMMONIA,  in the last 168 hours CBC:  Recent Labs Lab  12/21/12 2158 12/22/12 0233  WBC 17.4* 14.0*  NEUTROABS 14.9*  --   HGB 12.9* 12.2*  HCT 37.1* 35.4*  MCV 84.3 85.1  PLT 193 174   Cardiac Enzymes:  Recent Labs Lab 12/21/12 2158  TROPONINI <0.30   BNP: No components found with this basename: POCBNP,  CBG:  Recent Labs Lab 12/22/12 0740 12/22/12 1140 12/22/12 1714  GLUCAP 112* 158* 167*    No results found for this or any previous visit (from the past 240 hour(s)).   Scheduled Meds: . antiseptic oral rinse  15 mL Mouth Rinse BID  . budesonide (PULMICORT) nebulizer solution  0.25 mg Nebulization BID  . doxepin  50 mg Oral QHS  . enoxaparin (LOVENOX) injection  40 mg Subcutaneous Q24H  . insulin aspart  0-9 Units Subcutaneous TID WC  . ipratropium  0.5 mg Nebulization Q6H  . levalbuterol  0.63 mg Nebulization Q6H  . methylPREDNISolone (SOLU-MEDROL) injection  40 mg Intravenous Q24H  . piperacillin-tazobactam (ZOSYN)  IV  3.375 g Intravenous Q8H  . sertraline  100 mg Oral Daily  . sodium chloride  1,000 mL Intravenous Once  . sodium chloride  1,000 mL Intravenous Once  . sodium chloride  3 mL Intravenous Q12H  . vancomycin  1,000 mg Intravenous Q12H  . zolpidem  5 mg Oral QHS   Continuous Infusions: . sodium chloride 100 mL/hr at 12/22/12 1644     Nneoma Harral, DO  [facility] Pager [phone]  If 7PM-7AM, please contact night-coverage [email or web address] Password [facility] 12/22/2012, 6:55 PM   LOS: 1 day

## 2012-12-22 NOTE — H&P (Signed)
[facility] History and Physical  Braheem Tomasik ZOX:[id number] DOB: 26-Dec-1945 DOA: 12/21/2012  Referring physician: Dr. Juleen China. PCP: Provider Not In System  Specialists: None.  Chief Complaint: Left axillary pain and shortness of breath.  HPI: [name] [name] is a 67 y.o. male who was just recently discharged from hospital after being read for COPD presents with complaints of persistent left-sided axillary pain with subjective feeling of fever chills and shortness of breath. Patient states since he got discharged shortness of breath slowly progressive worsened. Last one week patient has been having increasing pain in the left axilla with redness. In the ER patient was found to have an axillary abscess which was incised and drained and at this time since the area involved is large is admitted for IV antibiotics. In addition patient also is wheezing but not acutely in distress.  Review of Systems: The patient denies anorexia, weight loss,, vision loss, decreased hearing, hoarseness, chest pain, syncope, dyspnea on exertion, peripheral edema, balance deficits, hemoptysis, abdominal pain, melena, hematochezia, severe indigestion/heartburn, hematuria, incontinence, genital sores, muscle weakness, suspicious skin lesions, transient blindness, difficulty walking, depression, unusual weight change, abnormal bleeding, enlarged lymph nodes, angioedema, and breast masses. Left-sided axillary pain and shortness of breath. Has subjective feeling of fever and chills. Otherwise as mentioned in history of present illness rest negative.  Past Medical History  Diagnosis Date  . COPD (chronic obstructive pulmonary disease)   . Asthma   . Emphysema   . PTSD (post-traumatic stress disorder)   . Tobacco abuse   . Pneumonia    Past Surgical History  Procedure Laterality Date  . Hernia repair     Social History:  reports that he has been smoking Cigarettes.  He has a 110 pack-year smoking history. He has  never used smokeless tobacco. He reports that  drinks alcohol. He reports that he does not use illicit drugs. Lives at home. where does patient live--home, ALF, SNF? and with whom if at home? Can do ADLs. Can patient participate in ADLs?  No Known Allergies  Family History  Problem Relation Age of Onset  . Asthma Cousin   . Asthma Cousin   . Diabetes Brother   . Diabetes Mother       Prior to Admission medications   Medication Sig Start Date End Date Taking? Authorizing Provider  albuterol (PROVENTIL HFA;VENTOLIN HFA) 108 (90 BASE) MCG/ACT inhaler Inhale 2 puffs into the lungs every 6 (six) hours as needed.   Yes Historical Provider, MD  albuterol (PROVENTIL) (5 MG/ML) 0.5% nebulizer solution Take 0.5 mLs (2.5 mg total) by nebulization every 2 (two) hours as needed for wheezing or shortness of breath. 12/06/12  Yes Dorothea Ogle, MD  budesonide-formoterol [facility]) 160-4.5 MCG/ACT inhaler Inhale 2 puffs into the lungs 2 (two) times daily.   Yes Historical Provider, MD  doxepin (SINEQUAN) 50 MG capsule Take 50 mg by mouth at bedtime.   Yes Historical Provider, MD  guaiFENesin-dextromethorphan (ROBITUSSIN DM) 100-10 MG/5ML syrup Take 5 mLs by mouth every 4 (four) hours as needed for cough. 12/06/12  Yes Dorothea Ogle, MD  sertraline (ZOLOFT) 100 MG tablet Take 100 mg by mouth daily.   Yes Historical Provider, MD  tiotropium (SPIRIVA) 18 MCG inhalation capsule Place 18 mcg into inhaler and inhale daily.   Yes Historical Provider, MD  zolpidem (AMBIEN) 10 MG tablet Take 10 mg by mouth at bedtime.   Yes Historical Provider, MD   Physical Exam: Filed Vitals:   12/21/12 2315 12/21/12 2330 12/21/12  2345 12/22/12 0004  BP: 125/68 128/71 129/77   Pulse: 107 101    Temp:    98.7 F (37.1 C)  TempSrc:      Resp:      SpO2: 97% 97%       General:  Well-developed and nourished.  Eyes: Anicteric no pallor.  ENT: No discharge from ears eyes nose or mouth.  Neck: No mass  felt.  Cardiovascular: S1-S2 heard.  Respiratory: Bilateral expiratory wheeze no rales.  Abdomen: Soft nontender. Hernia present but appears nonobstructed.  Skin: Skin left axillary area looks erythematous and indurated with inhaler which has been incised.  Musculoskeletal: Nontender.  Psychiatric: Appears normal.  Neurologic: Moves all extremities.  Labs on Admission:  Basic Metabolic Panel:  Recent Labs Lab 12/21/12 2158  NA 128*  K 3.5  CL 93*  CO2 24  GLUCOSE 96  BUN 12  CREATININE 0.86  CALCIUM 8.1*   Liver Function Tests:  Recent Labs Lab 12/21/12 2158  AST 8  ALT 11  ALKPHOS 61  BILITOT 1.5*  PROT 6.8  ALBUMIN 2.7*   No results found for this basename: LIPASE, AMYLASE,  in the last 168 hours No results found for this basename: AMMONIA,  in the last 168 hours CBC:  Recent Labs Lab 12/21/12 2158  WBC 17.4*  NEUTROABS 14.9*  HGB 12.9*  HCT 37.1*  MCV 84.3  PLT 193   Cardiac Enzymes:  Recent Labs Lab 12/21/12 2158  TROPONINI <0.30    BNP (last 3 results)  Recent Labs  12/01/12 1550 12/21/12 2158  PROBNP 456.5* 415.7*   CBG: No results found for this basename: GLUCAP,  in the last 168 hours  Radiological Exams on Admission: Dg Chest Portable 1 View  12/21/2012  *RADIOLOGY REPORT*  Clinical Data: Shortness of breath.  PORTABLE CHEST - 1 VIEW  Comparison: 12/01/2012  Findings: Normal heart size and pulmonary vascularity. Emphysematous changes and scattered fibrosis in the lungs.  No focal consolidation or airspace disease.  No blunting of costophrenic angles.  No pneumothorax.  Mediastinal contours appear intact.  Calcification of the aorta.  No significant change since previous study.  IMPRESSION: Emphysematous changes in the lungs.  No evidence of active pulmonary disease.   Original Report Authenticated By: Burman Nieves, M.D.      Assessment/Plan Principal Problem:   Cellulitis and abscess Active Problems:   COPD (chronic  obstructive pulmonary disease)   PTSD (post-traumatic stress disorder)   1. Left axillary cellulitis and abscess - the abscess has been incised and drained. Continue with IV antibiotics vancomycin and Zosyn for now. Follow blood and wound cultures. Patient was initially tachycardic which improved with IV fluids. Patient will be closely monitored and telemetry. 2. COPD exacerbation - patient has been placed on nebulizers and IV steroids. Chest x-ray has been unremarkable. EKG just showed sinus tachycardia with poor R-wave progression. 3. PTSD - continue present medications. 4. Tobacco abuse - patient states he quit since January. 5. Hyponatremia probably from dehydration - recheck metabolic panel after hydration.   Code Status: Full code. Family Communication: None at the bedside.  Disposition Plan: Admit to inpatient.  Jocelyn Lowery N. [facility] Pager [phone].  If 7PM-7AM, please contact night-coverage [email or web address] Password [facility] 12/22/2012, 12:29 AM

## 2012-12-22 NOTE — ED Notes (Signed)
Only one set of blood cultures will result. Two were ordered and drawn. One was disgarded by lab d/t not pt identification.

## 2012-12-22 NOTE — Progress Notes (Signed)
ANTIBIOTIC CONSULT NOTE - INITIAL  Pharmacy Consult for Vancomycin & Zosyn Indication: Cellulitis  No Known Allergies  Patient Measurements: Height: 5' 8.11" (173 cm) Weight: 169 lb 5 oz (76.8 kg) IBW/kg (Calculated) : 68.65   Vital Signs: Temp: 98.7 F (37.1 C) (02/25 0004) Temp src: Oral (02/24 2156) BP: 139/72 mmHg (02/25 0115) Pulse Rate: 84 (02/25 0115) Intake/Output from previous day:   Intake/Output from this shift:    Labs:  Recent Labs  12/21/12 2158  WBC 17.4*  HGB 12.9*  PLT 193  CREATININE 0.86   Estimated Creatinine Clearance: 81 ml/min (by C-G formula based on Cr of 0.86). No results found for this basename: VANCOTROUGH, VANCOPEAK, VANCORANDOM, GENTTROUGH, GENTPEAK, GENTRANDOM, TOBRATROUGH, TOBRAPEAK, TOBRARND, AMIKACINPEAK, AMIKACINTROU, AMIKACIN,  in the last 72 hours   Microbiology: No results found for this or any previous visit (from the past 720 hour(s)).  Medical History: Past Medical History  Diagnosis Date  . COPD (chronic obstructive pulmonary disease)   . Asthma   . Emphysema   . PTSD (post-traumatic stress disorder)   . Tobacco abuse   . Pneumonia     Medications:  Scheduled:  . [COMPLETED] acetaminophen  650 mg Oral Once  . budesonide (PULMICORT) nebulizer solution  0.25 mg Nebulization BID  . bupivacaine      . doxepin  50 mg Oral QHS  . enoxaparin (LOVENOX) injection  40 mg Subcutaneous Q24H  . insulin aspart  0-9 Units Subcutaneous TID WC  . ipratropium  0.5 mg Nebulization Q6H  . levalbuterol  0.63 mg Nebulization Q6H  . methylPREDNISolone (SOLU-MEDROL) injection  40 mg Intravenous Q24H  . [COMPLETED]  morphine injection  4 mg Intravenous Once  . [COMPLETED] ondansetron (ZOFRAN) IV  4 mg Intravenous Once  . sertraline  100 mg Oral Daily  . sodium chloride  1,000 mL Intravenous Once  . sodium chloride  1,000 mL Intravenous Once  . sodium chloride  3 mL Intravenous Q12H  . [COMPLETED] vancomycin  1,000 mg Intravenous  Once  . zolpidem  5 mg Oral QHS  . [DISCONTINUED] bupivacaine  5 mL Infiltration Once  . [DISCONTINUED] zolpidem  10 mg Oral QHS   Infusions:  . sodium chloride 100 mL/hr at 12/22/12 0208   Assessment:  67 yr old male recently hospitalized for PNA and subsequently developed a staph infection in the axillary area.  "Pimple" has grown, is erythematous and tender to the touch.  S/P I&D of the area in the ED.  Received Vancomycin 1gm IV x 1 in ED ~ 23:00 on 2/24  IV Vancomycin and Zosyn to continue upon admission for cellulitis with abscess   Goal of Therapy:  Vancomycin trough level 10-15 mcg/ml  Plan:  Measure antibiotic drug levels at steady state Follow up culture results Zosyn 3.375gm IV q8h (each dose infused over 4 hrs) Vancomycin 1000mg  IV q12h  Kimesha Claxton, Joselyn Glassman, PharmD 12/22/2012,2:14 AM

## 2012-12-22 NOTE — ED Notes (Signed)
Called 4W to give report, nurse unavailable to take report

## 2012-12-23 ENCOUNTER — Inpatient Hospital Stay (HOSPITAL_COMMUNITY): Payer: Medicare Other

## 2012-12-23 ENCOUNTER — Encounter (HOSPITAL_COMMUNITY): Payer: Self-pay | Admitting: Radiology

## 2012-12-23 DIAGNOSIS — A419 Sepsis, unspecified organism: Secondary | ICD-10-CM

## 2012-12-23 DIAGNOSIS — IMO0002 Reserved for concepts with insufficient information to code with codable children: Secondary | ICD-10-CM

## 2012-12-23 DIAGNOSIS — L02219 Cutaneous abscess of trunk, unspecified: Secondary | ICD-10-CM

## 2012-12-23 DIAGNOSIS — J9600 Acute respiratory failure, unspecified whether with hypoxia or hypercapnia: Secondary | ICD-10-CM

## 2012-12-23 LAB — BASIC METABOLIC PANEL
BUN: 13 mg/dL (ref 6–23)
Creatinine, Ser: 0.82 mg/dL (ref 0.50–1.35)
GFR calc Af Amer: >90 mL/min (ref >90)
GFR calc non Af Amer: 89 mL/min — ABNORMAL LOW (ref >90)
Glucose, Bld: 193 mg/dL — ABNORMAL HIGH (ref 70–99)

## 2012-12-23 LAB — CBC WITH DIFFERENTIAL/PLATELET
Basophils Relative: 0 % (ref 0–1)
Eosinophils Absolute: 0 10*3/uL (ref 0.0–0.7)
Eosinophils Relative: 0 % (ref 0–5)
HCT: 31.1 % — ABNORMAL LOW (ref 39.0–52.0)
Hemoglobin: 10.8 g/dL — ABNORMAL LOW (ref 13.0–17.0)
Lymphs Abs: 0.5 10*3/uL — ABNORMAL LOW (ref 0.7–4.0)
MCH: 29.4 pg (ref 26.0–34.0)
MCHC: 34.7 g/dL (ref 30.0–36.0)
MCV: 84.7 fL (ref 78.0–100.0)
Monocytes Absolute: 0.3 10*3/uL (ref 0.1–1.0)
Monocytes Relative: 2 % — ABNORMAL LOW (ref 3–12)
RBC: 3.67 MIL/uL — ABNORMAL LOW (ref 4.22–5.81)

## 2012-12-23 LAB — GLUCOSE, CAPILLARY
Glucose-Capillary: 161 mg/dL — ABNORMAL HIGH (ref 70–99)
Glucose-Capillary: 190 mg/dL — ABNORMAL HIGH (ref 70–99)
Glucose-Capillary: 158 mg/dL — ABNORMAL HIGH (ref 70–99)

## 2012-12-23 MED ORDER — IOHEXOL 300 MG/ML  SOLN
100.0000 mL | Freq: Once | INTRAMUSCULAR | Status: AC | PRN
  Administered 2012-12-23: 100 mL via INTRAVENOUS

## 2012-12-23 MED ORDER — METHYLPREDNISOLONE SODIUM SUCC 125 MG IJ SOLR
60.0000 mg | Freq: Two times a day (BID) | INTRAMUSCULAR | Status: DC
  Administered 2012-12-24 – 2012-12-25 (×3): 60 mg via INTRAVENOUS
  Filled 2012-12-23 (×5): qty 0.96

## 2012-12-23 NOTE — Progress Notes (Signed)
[facility] PROGRESS NOTE  [name] [name] FAO:[id number] DOB: 1946-06-21 DOA: 12/21/2012 PCP: Provider Not In System  Assessment/Plan: Cellulitis/axillary abscess, very slowly improving -Suspect MRSA -Continue empiric vancomycin and Zosyn -Wound culture with GPC in clusters -Still draining purulent material but CT without persistent fluid collection -blood cultures NGTD  Acute COPD exacerbation, resolving -Wean Solu-Medrol to BID - Continue nebulizer treatments -No respiratory distress -Over 100-pack-year tobacco; quit smoking in January  Hyperglycemia -Steroid-induced -Hemoglobin A1c 4.9 -NovoLog sliding scale  Hyponatremia -Likely volume depletion -Continue IV fluids -Check TSH  PTSD -Continue home medications   Family Communication:   Pt at beside Disposition Plan:   Home when medically stable  Procedures:  Bedside I&D 12/22/2012>>>  Antibiotics:  Vancomycin/Zosyn 12/22/2012>>>   Procedures/Studies: Dg Chest Portable 1 View  12/21/2012  *RADIOLOGY REPORT*  Clinical Data: Shortness of breath.  PORTABLE CHEST - 1 VIEW  Comparison: 12/01/2012  Findings: Normal heart size and pulmonary vascularity. Emphysematous changes and scattered fibrosis in the lungs.  No focal consolidation or airspace disease.  No blunting of costophrenic angles.  No pneumothorax.  Mediastinal contours appear intact.  Calcification of the aorta.  No significant change since previous study.  IMPRESSION: Emphysematous changes in the lungs.  No evidence of active pulmonary disease.   Original Report Authenticated By: Burman Nieves, M.D.    Dg Chest Port 1 View  12/01/2012  *RADIOLOGY REPORT*  Clinical Data: Shortness of breath.  COPD.  PORTABLE CHEST - 1 VIEW  Comparison: PA and lateral chest 12/18/2008.  Findings: The chest is hyperexpanded but the lungs are clear. Heart size is normal.  No pneumothorax or pleural fluid. Surgical clips at the gastroesophageal junction are again seen.   IMPRESSION: Hyperexpansion compatible with emphysema.  No acute abnormality.   Original Report Authenticated By: Holley Dexter, M.D.          Subjective: Patient is feeling slightly better today. He denies any fevers, chills, chest pain, nausea, vomiting, diarrhea, abdominal pain, dysuria, hematuria.  He continues to have pain with deep breathing in the right axilla and shortness of breath is mildly improved today but has dyspnea with minimal exertion such as sitting up to eat.    Objective: Filed Vitals:   12/22/12 2125 12/23/12 0531 12/23/12 1056 12/23/12 1500  BP: 139/82 116/59  144/75  Pulse: 98 87  99  Temp: 98 F (36.7 C) 97.4 F (36.3 C)  97.5 F (36.4 C)  TempSrc: Oral Oral  Oral  Resp: 20 20  20   Height:      Weight:      SpO2: 99% 98% 95% 100%    Intake/Output Summary (Last 24 hours) at 12/23/12 1721 Last data filed at 12/23/12 1500  Gross per 24 hour  Intake   2770 ml  Output   1500 ml  Net   1270 ml   Weight change:  Exam:   General:  Pt is alert, follows commands appropriately, not in acute distress  HEENT: No icterus, No thrush, [place]/AT  Cardiovascular: RRR, S1/S2, no rubs, no gallops  Respiratory: Bilateral full expiratory wheeze. No rhonchi or focal rales. Good air movement.  Abdomen: Soft/+BS, non tender, non distended, no guarding  Extremities: No edema, No lymphangitis, No petechiae, No rashes, no synovitis; left axilla with purulent drainage. Erythema extends to the lateral edge of the left sternum across the pect.  Induration extends to the anterior axillary line--no crepitance--no necrosis.  Purulent drainage apparent.    Data Reviewed: Basic Metabolic Panel:  Recent Labs Lab 12/21/12  2158 12/22/12 0233 12/23/12 0452  NA 128* 128* 134*  K 3.5 3.6 4.2  CL 93* 92* 98  CO2 24 27 28   GLUCOSE 96 111* 193*  BUN 12 14 13   CREATININE 0.86 0.99 0.82  CALCIUM 8.1* 8.7 8.8   Liver Function Tests:  Recent Labs Lab 12/21/12 2158  AST 8   ALT 11  ALKPHOS 61  BILITOT 1.5*  PROT 6.8  ALBUMIN 2.7*   No results found for this basename: LIPASE, AMYLASE,  in the last 168 hours No results found for this basename: AMMONIA,  in the last 168 hours CBC:  Recent Labs Lab 12/21/12 2158 12/22/12 0233 12/23/12 0452  WBC 17.4* 14.0* 11.7*  NEUTROABS 14.9*  --  11.0*  HGB 12.9* 12.2* 10.8*  HCT 37.1* 35.4* 31.1*  MCV 84.3 85.1 84.7  PLT 193 174 177   Cardiac Enzymes:  Recent Labs Lab 12/21/12 2158  TROPONINI <0.30   BNP: No components found with this basename: POCBNP,  CBG:  Recent Labs Lab 12/22/12 1714 12/22/12 2100 12/23/12 0752 12/23/12 1218 12/23/12 1700  GLUCAP 167* 136* 180* 161* 190*    Recent Results (from the past 240 hour(s))  CULTURE, BLOOD (ROUTINE X 2)     Status: None   Collection Time    12/21/12 10:35 PM      Result Value Range Status   Specimen Description BLOOD LEFT ARM   Final   Special Requests BOTTLES DRAWN AEROBIC AND ANAEROBIC 5CC   Final   Culture  Setup Time 12/22/2012 05:23   Final   Culture     Final   Value:        BLOOD CULTURE RECEIVED NO GROWTH TO DATE CULTURE WILL BE HELD FOR 5 DAYS BEFORE ISSUING A FINAL NEGATIVE REPORT   Report Status PENDING   Incomplete  CULTURE, BLOOD (ROUTINE X 2)     Status: None   Collection Time    12/22/12  2:33 AM      Result Value Range Status   Specimen Description BLOOD LEFT ANTECUBITAL   Final   Special Requests BOTTLES DRAWN AEROBIC AND ANAEROBIC 5CC   Final   Culture  Setup Time 12/22/2012 09:01   Final   Culture     Final   Value:        BLOOD CULTURE RECEIVED NO GROWTH TO DATE CULTURE WILL BE HELD FOR 5 DAYS BEFORE ISSUING A FINAL NEGATIVE REPORT   Report Status PENDING   Incomplete  CULTURE, ROUTINE-ABSCESS     Status: None   Collection Time    12/22/12 12:26 PM      Result Value Range Status   Specimen Description ABSCESS LT ARM AXILLA   Final   Special Requests Normal   Final   Gram Stain     Final   Value: NO WBC SEEN      FEW SQUAMOUS EPITHELIAL CELLS PRESENT     RARE GRAM POSITIVE COCCI IN CLUSTERS     IN PAIRS   Culture Culture reincubated for better growth   Final   Report Status PENDING   Incomplete     Scheduled Meds: . antiseptic oral rinse  15 mL Mouth Rinse BID  . budesonide (PULMICORT) nebulizer solution  0.25 mg Nebulization BID  . doxepin  50 mg Oral QHS  . enoxaparin (LOVENOX) injection  40 mg Subcutaneous Q24H  . insulin aspart  0-9 Units Subcutaneous TID WC  . ipratropium  0.5 mg Nebulization Q6H  . levalbuterol  0.63 mg Nebulization Q6H  . methylPREDNISolone (SOLU-MEDROL) injection  60 mg Intravenous Q6H  . piperacillin-tazobactam (ZOSYN)  IV  3.375 g Intravenous Q8H  . sertraline  100 mg Oral Daily  . sodium chloride  1,000 mL Intravenous Once  . sodium chloride  1,000 mL Intravenous Once  . sodium chloride  3 mL Intravenous Q12H  . vancomycin  1,000 mg Intravenous Q12H  . zolpidem  5 mg Oral QHS   Continuous Infusions:     Renae Fickle, MD  [facility] Pager [phone]  If 7PM-7AM, please contact night-coverage [email or web address] Password [facility] 12/23/2012, 5:21 PM   LOS: 2 days

## 2012-12-24 DIAGNOSIS — J441 Chronic obstructive pulmonary disease with (acute) exacerbation: Secondary | ICD-10-CM

## 2012-12-24 DIAGNOSIS — R7309 Other abnormal glucose: Secondary | ICD-10-CM

## 2012-12-24 LAB — BASIC METABOLIC PANEL
CO2: 28 mEq/L (ref 19–32)
Calcium: 8.6 mg/dL (ref 8.4–10.5)
GFR calc non Af Amer: >90 mL/min (ref >90)
Glucose, Bld: 185 mg/dL — ABNORMAL HIGH (ref 70–99)
Potassium: 3.5 mEq/L (ref 3.5–5.1)
Sodium: 137 mEq/L (ref 135–145)

## 2012-12-24 LAB — CBC
Hemoglobin: 10 g/dL — ABNORMAL LOW (ref 13.0–17.0)
MCH: 28.7 pg (ref 26.0–34.0)
Platelets: 195 10*3/uL (ref 150–400)
RBC: 3.48 MIL/uL — ABNORMAL LOW (ref 4.22–5.81)
WBC: 10.9 10*3/uL — ABNORMAL HIGH (ref 4.0–10.5)

## 2012-12-24 LAB — GLUCOSE, CAPILLARY
Glucose-Capillary: 157 mg/dL — ABNORMAL HIGH (ref 70–99)
Glucose-Capillary: 157 mg/dL — ABNORMAL HIGH (ref 70–99)

## 2012-12-24 MED ORDER — ALBUTEROL SULFATE HFA 108 (90 BASE) MCG/ACT IN AERS
2.0000 | INHALATION_SPRAY | RESPIRATORY_TRACT | Status: DC | PRN
  Administered 2012-12-24: 2 via RESPIRATORY_TRACT
  Filled 2012-12-24: qty 6.7

## 2012-12-24 MED ORDER — FLUTICASONE PROPIONATE 50 MCG/ACT NA SUSP
2.0000 | Freq: Every day | NASAL | Status: DC
  Administered 2012-12-24 – 2012-12-25 (×2): 2 via NASAL
  Filled 2012-12-24: qty 16

## 2012-12-24 MED ORDER — PANTOPRAZOLE SODIUM 40 MG PO TBEC
40.0000 mg | DELAYED_RELEASE_TABLET | Freq: Every day | ORAL | Status: DC
  Administered 2012-12-24 – 2012-12-26 (×3): 40 mg via ORAL
  Filled 2012-12-24 (×2): qty 1

## 2012-12-24 MED ORDER — INSULIN ASPART 100 UNIT/ML ~~LOC~~ SOLN
0.0000 [IU] | Freq: Three times a day (TID) | SUBCUTANEOUS | Status: DC
  Administered 2012-12-24: 3 [IU] via SUBCUTANEOUS
  Administered 2012-12-25: 5 [IU] via SUBCUTANEOUS
  Administered 2012-12-25: 3 [IU] via SUBCUTANEOUS

## 2012-12-24 MED ORDER — SALINE SPRAY 0.65 % NA SOLN: 1.0000 | NASAL | Status: DC | PRN

## 2012-12-24 MED ORDER — MENTHOL 3 MG MT LOZG
1.0000 | LOZENGE | OROMUCOSAL | Status: DC | PRN
  Administered 2012-12-24: 3 mg via ORAL
  Filled 2012-12-24: qty 9

## 2012-12-24 MED ORDER — GLUCERNA 1.2 CAL PO LIQD: 1000.0000 mL | ORAL | Status: DC

## 2012-12-24 MED ORDER — ALBUTEROL SULFATE (5 MG/ML) 0.5% IN NEBU
2.5000 mg | INHALATION_SOLUTION | Freq: Four times a day (QID) | RESPIRATORY_TRACT | Status: DC
  Administered 2012-12-24 – 2012-12-26 (×6): 2.5 mg via RESPIRATORY_TRACT
  Filled 2012-12-24 (×7): qty 0.5

## 2012-12-24 MED ORDER — INSULIN ASPART 100 UNIT/ML ~~LOC~~ SOLN: 0.0000 [IU] | Freq: Every day | SUBCUTANEOUS | Status: DC

## 2012-12-24 NOTE — Progress Notes (Addendum)
[facility] PROGRESS NOTE  [name] [name] ZOX:[id number] DOB: 04/30/1946 DOA: 12/21/2012 PCP: Provider Not In System  Assessment/Plan: S. Aureus cellulitis/axillary abscess, less induration, drainage, and erythema today -Suspect MRSA, but final report of wound culture still pending -Continue vancomycin -  D/c Zosyn -blood cultures NGTD  Acute COPD exacerbation, increased wheezing and difficulty talking today.  Ddx includes medication side effect, GERD, sinusitis/allergic rhinitis.  -continue Solu-Medrol to BID -  Continue budesonide - Continue nebulizer treatments q6h and advised patient to ask for additional albuterol treatments if needed -Over 100-pack-year tobacco; quit smoking in January - start protonix, flonase, nasal saline  Hyperglycemia, 157-224 today, Steroid-induced.   -Hemoglobin A1c 4.9 -Incr to aspart SSI moderate   Hyponatremia, resolved with IVF -Likely volume depletion  PTSD -Continue home medications  DIET:  Healthy haert ACCESS:  PIV IVF:  None PROPH:    CODE:  Full code Family Communication:   Pt at beside Disposition Plan:   Home when medically stable  Procedures:  Bedside I&D 12/22/2012>>>  Antibiotics:  Vancomycin 2/25 >>  Zosyn 12/22/2012 >> 2/27   Procedures/Studies: Dg Chest Portable 1 View  12/21/2012  *RADIOLOGY REPORT*  Clinical Data: Shortness of breath.  PORTABLE CHEST - 1 VIEW  Comparison: 12/01/2012  Findings: Normal heart size and pulmonary vascularity. Emphysematous changes and scattered fibrosis in the lungs.  No focal consolidation or airspace disease.  No blunting of costophrenic angles.  No pneumothorax.  Mediastinal contours appear intact.  Calcification of the aorta.  No significant change since previous study.  IMPRESSION: Emphysematous changes in the lungs.  No evidence of active pulmonary disease.   Original Report Authenticated By: Burman Nieves, M.D.    Dg Chest Port 1 View  12/01/2012  *RADIOLOGY REPORT*   Clinical Data: Shortness of breath.  COPD.  PORTABLE CHEST - 1 VIEW  Comparison: PA and lateral chest 12/18/2008.  Findings: The chest is hyperexpanded but the lungs are clear. Heart size is normal.  No pneumothorax or pleural fluid. Surgical clips at the gastroesophageal junction are again seen.  IMPRESSION: Hyperexpansion compatible with emphysema.  No acute abnormality.   Original Report Authenticated By: Holley Dexter, M.D.          Subjective: Patient is feeling slightly better today in terms of his abscess and cellulitis, however, his breathing is worse. He denies any fevers, chills, chest pain, nausea, vomiting, diarrhea, abdominal pain, dysuria, hematuria.  Dyspnea with talking today.    Objective: Filed Vitals:   12/24/12 0529 12/24/12 0803 12/24/12 1159 12/24/12 1409  BP: 146/84   150/78  Pulse: 91   93  Temp: 97.4 F (36.3 C)   97.5 F (36.4 C)  TempSrc: Oral   Oral  Resp: 20   20  Height:      Weight:      SpO2: 96% 95% 95% 97%    Intake/Output Summary (Last 24 hours) at 12/24/12 1452 Last data filed at 12/24/12 1300  Gross per 24 hour  Intake   1270 ml  Output   1600 ml  Net   -330 ml   Weight change:  Exam:   General:  Pt is alert, follows commands appropriately, pursed lip breathing occasionally and Aliz Meritt of breath with talking.  SCM retractions  HEENT: No icterus, No thrush, [place]/AT  Cardiovascular: RRR, S1/S2, no rubs, no gallops  Respiratory: Bilateral full expiratory wheeze, diminished at bases.  No rhonchi or focal rales.   Abdomen: Soft/+BS, non tender, non distended, no guarding  Extremities: No edema, No  lymphangitis, No petechiae, No rashes, no synovitis; left axilla with purulent drainage. Erythema is less red with less distinct border still extending to the lateral edge of the left sternum across the pect.  Induration extends to the anterior axillary line--no crepitance--no necrosis.    Data Reviewed: Basic Metabolic Panel:  Recent  Labs Lab 12/21/12 2158 12/22/12 0233 12/23/12 0452 12/24/12 0509  NA 128* 128* 134* 137  K 3.5 3.6 4.2 3.5  CL 93* 92* 98 99  CO2 24 27 28 28   GLUCOSE 96 111* 193* 185*  BUN 12 14 13 12   CREATININE 0.86 0.99 0.82 0.76  CALCIUM 8.1* 8.7 8.8 8.6   Liver Function Tests:  Recent Labs Lab 12/21/12 2158  AST 8  ALT 11  ALKPHOS 61  BILITOT 1.5*  PROT 6.8  ALBUMIN 2.7*   No results found for this basename: LIPASE, AMYLASE,  in the last 168 hours No results found for this basename: AMMONIA,  in the last 168 hours CBC:  Recent Labs Lab 12/21/12 2158 12/22/12 0233 12/23/12 0452 12/24/12 0509  WBC 17.4* 14.0* 11.7* 10.9*  NEUTROABS 14.9*  --  11.0*  --   HGB 12.9* 12.2* 10.8* 10.0*  HCT 37.1* 35.4* 31.1* 29.9*  MCV 84.3 85.1 84.7 85.9  PLT 193 174 177 195   Cardiac Enzymes:  Recent Labs Lab 12/21/12 2158  TROPONINI <0.30   BNP: No components found with this basename: POCBNP,  CBG:  Recent Labs Lab 12/23/12 1218 12/23/12 1700 12/23/12 2052 12/24/12 0644 12/24/12 1156  GLUCAP 161* 190* 158* 157* 224*    Recent Results (from the past 240 hour(s))  CULTURE, BLOOD (ROUTINE X 2)     Status: None   Collection Time    12/21/12 10:35 PM      Result Value Range Status   Specimen Description BLOOD LEFT ARM   Final   Special Requests BOTTLES DRAWN AEROBIC AND ANAEROBIC 5CC   Final   Culture  Setup Time 12/22/2012 05:23   Final   Culture     Final   Value:        BLOOD CULTURE RECEIVED NO GROWTH TO DATE CULTURE WILL BE HELD FOR 5 DAYS BEFORE ISSUING A FINAL NEGATIVE REPORT   Report Status PENDING   Incomplete  CULTURE, BLOOD (ROUTINE X 2)     Status: None   Collection Time    12/22/12  2:33 AM      Result Value Range Status   Specimen Description BLOOD LEFT ANTECUBITAL   Final   Special Requests BOTTLES DRAWN AEROBIC AND ANAEROBIC 5CC   Final   Culture  Setup Time 12/22/2012 09:01   Final   Culture     Final   Value:        BLOOD CULTURE RECEIVED NO GROWTH  TO DATE CULTURE WILL BE HELD FOR 5 DAYS BEFORE ISSUING A FINAL NEGATIVE REPORT   Report Status PENDING   Incomplete  CULTURE, ROUTINE-ABSCESS     Status: None   Collection Time    12/22/12 12:26 PM      Result Value Range Status   Specimen Description ABSCESS LT ARM AXILLA   Final   Special Requests Normal   Final   Gram Stain     Final   Value: NO WBC SEEN     FEW SQUAMOUS EPITHELIAL CELLS PRESENT     RARE GRAM POSITIVE COCCI IN CLUSTERS     IN PAIRS   Culture     Final  Value: MODERATE STAPHYLOCOCCUS AUREUS     Note: RIFAMPIN AND GENTAMICIN SHOULD NOT BE USED AS SINGLE DRUGS FOR TREATMENT OF STAPH INFECTIONS.   Report Status PENDING   Incomplete     Scheduled Meds: . albuterol  2.5 mg Nebulization Q6H  . antiseptic oral rinse  15 mL Mouth Rinse BID  . budesonide (PULMICORT) nebulizer solution  0.25 mg Nebulization BID  . doxepin  50 mg Oral QHS  . enoxaparin (LOVENOX) injection  40 mg Subcutaneous Q24H  . insulin aspart  0-9 Units Subcutaneous TID WC  . ipratropium  0.5 mg Nebulization Q6H  . methylPREDNISolone (SOLU-MEDROL) injection  60 mg Intravenous Q12H  . sertraline  100 mg Oral Daily  . sodium chloride  1,000 mL Intravenous Once  . sodium chloride  1,000 mL Intravenous Once  . sodium chloride  3 mL Intravenous Q12H  . vancomycin  1,000 mg Intravenous Q12H  . zolpidem  5 mg Oral QHS   Continuous Infusions:     Renae Fickle, MD  [facility] Pager [phone]  If 7PM-7AM, please contact night-coverage [email or web address] Password [facility] 12/24/2012, 2:52 PM   LOS: 3 days

## 2012-12-24 NOTE — Progress Notes (Signed)
PHARMACY BRIEF NOTE:  VANCOMYCIN  See full progress note from earlier this AM.  Vancomycin trough reported as 9.4 on 1000 mg IV q12h, D#4  SCr 0.76 (stable) WBC 10.9 (improving) Tmax 97.7  Assessment: Vancomycin trough is near lower end of usual goal range for cellulitis (10-15).   Given clinical improvement and the potential risk of nephrotoxicity with aggressive dosing in older patients, do not believe dosage increase is indicated at present.  Plan: 1. Continue vancomycin 1000 mg IV q12h. 2. Await susceptibility report on Staph aureus wound culture isolate. 3. Follow clinical course and serum creatinine.  Elie Goody, PharmD, BCPS Pager: [phone] 12/24/2012  11:58 AM

## 2012-12-24 NOTE — Progress Notes (Signed)
ANTIBIOTIC CONSULT NOTE - FOLLOW UP  Pharmacy Consult for Vanco Indication: Cellulitis/axillary abscess  No Known Allergies  Patient Measurements: Height: 5' 8.11" (173 cm) Weight: 169 lb 5 oz (76.8 kg) IBW/kg (Calculated) : 68.65  Vital Signs: Temp: 97.4 F (36.3 C) (02/27 0529) Temp src: Oral (02/27 0529) BP: 146/84 mmHg (02/27 0529) Pulse Rate: 91 (02/27 0529)  Intake/Output from previous day: 02/26 0701 - 02/27 0700 In: 1270 [P.O.:720; IV Piggyback:550] Out: 1650 [Urine:1650]  Labs:  Recent Labs  12/22/12 0233 12/23/12 0452 12/24/12 0509  WBC 14.0* 11.7* 10.9*  HGB 12.2* 10.8* 10.0*  PLT 174 177 195  CREATININE 0.99 0.82 0.76   Estimated Creatinine Clearance: 87.1 ml/min (by C-G formula based on Cr of 0.76). No results found for this basename: VANCOTROUGH, Leodis Binet, VANCORANDOM, GENTTROUGH, GENTPEAK, GENTRANDOM, TOBRATROUGH, TOBRAPEAK, TOBRARND, AMIKACINPEAK, AMIKACINTROU, AMIKACIN,  in the last 72 hours   Microbiology: 2/25 blood x2: NGTD 2/25 abscess: moderate S. aureus  Anti-infectives   Start     Dose/Rate Route Frequency Ordered Stop   12/22/12 1100  vancomycin (VANCOCIN) IVPB 1000 mg/200 mL premix     1,000 mg 200 mL/hr over 60 Minutes Intravenous Every 12 hours 12/22/12 0227     12/22/12 0300  piperacillin-tazobactam (ZOSYN) IVPB 3.375 g  Status:  Discontinued     3.375 g 12.5 mL/hr over 240 Minutes Intravenous Every 8 hours 12/22/12 0227 12/24/12 0739   12/21/12 2300  vancomycin (VANCOCIN) IVPB 1000 mg/200 mL premix     1,000 mg 200 mL/hr over 60 Minutes Intravenous  Once 12/21/12 2214 12/22/12 0003      Assessment: 67 yo M on Day #4 Vanco 1g IV q12h for L axillary abscess/cellulitis. Wound Cx (obtained after abx started) is currently growing moderate S. aureus. Zosyn has been d/c'd by MD this am. MD note indicates slow improvement and the presences of purulent drainage. Will obtain Vanco trough today at 10:30am.  Goal of Therapy:  Vancomycin  trough level 10-15 mcg/ml  Plan:  1) Vanco trough at 10:30am today - Pharmacy will f/u with results and dose changes as needed/indicated.  Darrol Angel, PharmD Pager: [phone] 12/24/2012,7:47 AM

## 2012-12-25 DIAGNOSIS — R030 Elevated blood-pressure reading, without diagnosis of hypertension: Secondary | ICD-10-CM

## 2012-12-25 DIAGNOSIS — I10 Essential (primary) hypertension: Secondary | ICD-10-CM

## 2012-12-25 DIAGNOSIS — A4902 Methicillin resistant Staphylococcus aureus infection, unspecified site: Secondary | ICD-10-CM

## 2012-12-25 LAB — CULTURE, ROUTINE-ABSCESS

## 2012-12-25 LAB — BASIC METABOLIC PANEL
CO2: 30 mEq/L (ref 19–32)
Calcium: 8.9 mg/dL (ref 8.4–10.5)
GFR calc non Af Amer: >90 mL/min (ref >90)
Glucose, Bld: 157 mg/dL — ABNORMAL HIGH (ref 70–99)
Potassium: 3.8 mEq/L (ref 3.5–5.1)
Sodium: 137 mEq/L (ref 135–145)

## 2012-12-25 LAB — CBC
Hemoglobin: 11 g/dL — ABNORMAL LOW (ref 13.0–17.0)
Platelets: 202 10*3/uL (ref 150–400)
RBC: 3.85 MIL/uL — ABNORMAL LOW (ref 4.22–5.81)
WBC: 9.2 10*3/uL (ref 4.0–10.5)

## 2012-12-25 LAB — GLUCOSE, CAPILLARY
Glucose-Capillary: 120 mg/dL — ABNORMAL HIGH (ref 70–99)
Glucose-Capillary: 229 mg/dL — ABNORMAL HIGH (ref 70–99)
Glucose-Capillary: 153 mg/dL — ABNORMAL HIGH (ref 70–99)
Glucose-Capillary: 162 mg/dL — ABNORMAL HIGH (ref 70–99)

## 2012-12-25 MED ORDER — AMLODIPINE BESYLATE 5 MG PO TABS
5.0000 mg | ORAL_TABLET | Freq: Every day | ORAL | Status: DC
  Administered 2012-12-25 – 2012-12-26 (×2): 5 mg via ORAL
  Filled 2012-12-25 (×2): qty 1

## 2012-12-25 MED ORDER — PREDNISONE 50 MG PO TABS
60.0000 mg | ORAL_TABLET | Freq: Every day | ORAL | Status: DC
  Administered 2012-12-26: 60 mg via ORAL
  Filled 2012-12-25 (×2): qty 1

## 2012-12-25 NOTE — Progress Notes (Addendum)
[facility] PROGRESS NOTE  [name] [name] ZOX:[id number] DOB: 10/15/1946 DOA: 12/21/2012 PCP: Provider Not In System  Assessment/Plan: MRSA left axillary cellulitis and abscess, less induration, drainage, and erythema today - Continue vancomycin - blood cultures NGTD -  Plan to transition to clindamycin tomorrow if still appearing improved  Acute COPD exacerbation, less wheezing and difficulty talking today.  Ddx includes medication side effect, GERD, sinusitis/allergic rhinitis. Over 100-pack-year tobacco; quit smoking in January - wean to once daily solumedrol today -  Continue budesonide - Continue nebulizer treatments q6h and advised patient to ask for additional albuterol treatments if needed - Continue protonix, flonase, nasal saline -  Walking pulse oximetry prior to discharge  Elevated blood pressure, may be due to steroid use, but has persisted during his recent admissions -  Start norvasc 5mg  (avoid BB and ACEI/ARB in setting of severe COPD)  Hyperglycemia, 120 today, Steroid-induced.   -Hemoglobin A1c 4.9 - continue aspart SSI moderate   PTSD stable -Continue home medications  DIET:  Healthy haert ACCESS:  PIV IVF:  None PROPH:  Lovenox  CODE:  Full code Family Communication:   Pt at beside Disposition Plan:   Anticipate discharge to home tomorrow on oral clindamycin   Procedures:  Bedside I&D 12/22/2012>>>  Antibiotics:  Vancomycin 2/25 >>  Zosyn 12/22/2012 >> 2/27   Procedures/Studies: Dg Chest Portable 1 View  12/21/2012  *RADIOLOGY REPORT*  Clinical Data: Shortness of breath.  PORTABLE CHEST - 1 VIEW  Comparison: 12/01/2012  Findings: Normal heart size and pulmonary vascularity. Emphysematous changes and scattered fibrosis in the lungs.  No focal consolidation or airspace disease.  No blunting of costophrenic angles.  No pneumothorax.  Mediastinal contours appear intact.  Calcification of the aorta.  No significant change since previous study.   IMPRESSION: Emphysematous changes in the lungs.  No evidence of active pulmonary disease.   Original Report Authenticated By: Burman Nieves, M.D.    Dg Chest Port 1 View  12/01/2012  *RADIOLOGY REPORT*  Clinical Data: Shortness of breath.  COPD.  PORTABLE CHEST - 1 VIEW  Comparison: PA and lateral chest 12/18/2008.  Findings: The chest is hyperexpanded but the lungs are clear. Heart size is normal.  No pneumothorax or pleural fluid. Surgical clips at the gastroesophageal junction are again seen.  IMPRESSION: Hyperexpansion compatible with emphysema.  No acute abnormality.   Original Report Authenticated By: Holley Dexter, M.D.     Subjective: Patient is feeling slightly better today in terms of his abscess and cellulitis and his breathing is better today.  He denies fevers, chills, chest pain, nausea, vomiting, diarrhea, abdominal pain, dysuria, hematuria.      Objective: Filed Vitals:   12/25/12 0534 12/25/12 0755 12/25/12 1436 12/25/12 1450  BP: 132/75  157/77   Pulse: 95  88   Temp: 97.4 F (36.3 C)  97.7 F (36.5 C)   TempSrc: Oral  Oral   Resp: 20  20   Height:      Weight:      SpO2: 97% 94% 100% 98%    Intake/Output Summary (Last 24 hours) at 12/25/12 1701 Last data filed at 12/25/12 1437  Gross per 24 hour  Intake   1040 ml  Output   1525 ml  Net   -485 ml   Weight change:  Exam:   General:  Pt is alert, less Cillian Gwinner of breath with talking.  No acute distress  HEENT: No icterus, No thrush, [place]/AT  Cardiovascular: RRR, S1/S2, no rubs, no gallops  Respiratory:  Bilateral full expiratory wheeze, louder than yesterday, diminished at bases.  No rhonchi or focal rales.   Abdomen: Soft/+BS, non tender, non distended, no guarding  Extremities:  left axilla with purulent drainage. Erythema is less red with less distinct border and extending to the mid left pect.  Induration extends to the anterior axillary line and is softer today--no crepitance--no necrosis.    Data  Reviewed: Basic Metabolic Panel:  Recent Labs Lab 12/21/12 2158 12/22/12 0233 12/23/12 0452 12/24/12 0509 12/25/12 0430  NA 128* 128* 134* 137 137  K 3.5 3.6 4.2 3.5 3.8  CL 93* 92* 98 99 98  CO2 24 27 28 28 30   GLUCOSE 96 111* 193* 185* 157*  BUN 12 14 13 12 12   CREATININE 0.86 0.99 0.82 0.76 0.71  CALCIUM 8.1* 8.7 8.8 8.6 8.9   Liver Function Tests:  Recent Labs Lab 12/21/12 2158  AST 8  ALT 11  ALKPHOS 61  BILITOT 1.5*  PROT 6.8  ALBUMIN 2.7*   No results found for this basename: LIPASE, AMYLASE,  in the last 168 hours No results found for this basename: AMMONIA,  in the last 168 hours CBC:  Recent Labs Lab 12/21/12 2158 12/22/12 0233 12/23/12 0452 12/24/12 0509 12/25/12 0430  WBC 17.4* 14.0* 11.7* 10.9* 9.2  NEUTROABS 14.9*  --  11.0*  --   --   HGB 12.9* 12.2* 10.8* 10.0* 11.0*  HCT 37.1* 35.4* 31.1* 29.9* 33.3*  MCV 84.3 85.1 84.7 85.9 86.5  PLT 193 174 177 195 202   Cardiac Enzymes:  Recent Labs Lab 12/21/12 2158  TROPONINI <0.30   BNP: No components found with this basename: POCBNP,  CBG:  Recent Labs Lab 12/24/12 0644 12/24/12 1156 12/24/12 1700 12/24/12 2050 12/25/12 0755  GLUCAP 157* 224* 157* 158* 120*    Recent Results (from the past 240 hour(s))  CULTURE, BLOOD (ROUTINE X 2)     Status: None   Collection Time    12/21/12 10:35 PM      Result Value Range Status   Specimen Description BLOOD LEFT ARM   Final   Special Requests BOTTLES DRAWN AEROBIC AND ANAEROBIC 5CC   Final   Culture  Setup Time 12/22/2012 05:23   Final   Culture     Final   Value:        BLOOD CULTURE RECEIVED NO GROWTH TO DATE CULTURE WILL BE HELD FOR 5 DAYS BEFORE ISSUING A FINAL NEGATIVE REPORT   Report Status PENDING   Incomplete  CULTURE, BLOOD (ROUTINE X 2)     Status: None   Collection Time    12/22/12  2:33 AM      Result Value Range Status   Specimen Description BLOOD LEFT ANTECUBITAL   Final   Special Requests BOTTLES DRAWN AEROBIC AND  ANAEROBIC 5CC   Final   Culture  Setup Time 12/22/2012 09:01   Final   Culture     Final   Value:        BLOOD CULTURE RECEIVED NO GROWTH TO DATE CULTURE WILL BE HELD FOR 5 DAYS BEFORE ISSUING A FINAL NEGATIVE REPORT   Report Status PENDING   Incomplete  CULTURE, ROUTINE-ABSCESS     Status: None   Collection Time    12/22/12 12:26 PM      Result Value Range Status   Specimen Description ABSCESS LT ARM AXILLA   Final   Special Requests Normal   Final   Gram Stain     Final  Value: NO WBC SEEN     FEW SQUAMOUS EPITHELIAL CELLS PRESENT     RARE GRAM POSITIVE COCCI IN CLUSTERS     IN PAIRS   Culture     Final   Value: MODERATE METHICILLIN RESISTANT STAPHYLOCOCCUS AUREUS     Note: RIFAMPIN AND GENTAMICIN SHOULD NOT BE USED AS SINGLE DRUGS FOR TREATMENT OF STAPH INFECTIONS. This organism DOES NOT demonstrate inducible Clindamycin resistance in vitro. CRITICAL RESULT CALLED TO, READ BACK BY AND VERIFIED WITH: ANNIE HODGES      12/25/12 0955 BY [email or web address]   Report Status 12/25/2012 FINAL   Final   Organism ID, Bacteria METHICILLIN RESISTANT STAPHYLOCOCCUS AUREUS   Final     Scheduled Meds: . albuterol  2.5 mg Nebulization Q6H  . antiseptic oral rinse  15 mL Mouth Rinse BID  . budesonide (PULMICORT) nebulizer solution  0.25 mg Nebulization BID  . doxepin  50 mg Oral QHS  . enoxaparin (LOVENOX) injection  40 mg Subcutaneous Q24H  . fluticasone  2 spray Each Nare Daily  . insulin aspart  0-15 Units Subcutaneous TID WC  . insulin aspart  0-5 Units Subcutaneous QHS  . ipratropium  0.5 mg Nebulization Q6H  . methylPREDNISolone (SOLU-MEDROL) injection  60 mg Intravenous Q12H  . pantoprazole  40 mg Oral Daily  . sertraline  100 mg Oral Daily  . sodium chloride  1,000 mL Intravenous Once  . sodium chloride  1,000 mL Intravenous Once  . sodium chloride  3 mL Intravenous Q12H  . vancomycin  1,000 mg Intravenous Q12H  . zolpidem  5 mg Oral QHS   Continuous Infusions:     Renae Fickle,  MD  [facility] Pager [phone]  If 7PM-7AM, please contact night-coverage [email or web address] Password [facility] 12/25/2012, 5:01 PM   LOS: 4 days

## 2012-12-25 NOTE — Progress Notes (Signed)
Lab called Axillary Wound positive for MRSA.

## 2012-12-25 NOTE — Progress Notes (Signed)
Writer has attempted to ambulate pt many times today, he has been resistant to go out side of room to ambulate. Verbalizes he will walk with evening NT. Instructed that MD has ordered a pulse ox after walking.

## 2012-12-26 LAB — CBC
Hemoglobin: 12.7 g/dL — ABNORMAL LOW (ref 13.0–17.0)
MCH: 28.5 pg (ref 26.0–34.0)
Platelets: 228 10*3/uL (ref 150–400)
RBC: 4.46 MIL/uL (ref 4.22–5.81)
WBC: 10.1 10*3/uL (ref 4.0–10.5)

## 2012-12-26 LAB — BASIC METABOLIC PANEL
Calcium: 8.6 mg/dL (ref 8.4–10.5)
GFR calc Af Amer: >90 mL/min (ref >90)
GFR calc non Af Amer: 89 mL/min — ABNORMAL LOW (ref >90)
Potassium: 3.7 mEq/L (ref 3.5–5.1)
Sodium: 139 mEq/L (ref 135–145)

## 2012-12-26 LAB — GLUCOSE, CAPILLARY: Glucose-Capillary: 84 mg/dL (ref 70–99)

## 2012-12-26 MED ORDER — PREDNISONE 10 MG PO TABS: ORAL_TABLET | ORAL | Status: DC

## 2012-12-26 MED ORDER — ALBUTEROL SULFATE (5 MG/ML) 0.5% IN NEBU: 2.5000 mg | INHALATION_SOLUTION | RESPIRATORY_TRACT | Status: AC | PRN

## 2012-12-26 MED ORDER — CLINDAMYCIN HCL 300 MG PO CAPS: 600.0000 mg | ORAL_CAPSULE | Freq: Three times a day (TID) | ORAL | Status: DC

## 2012-12-26 MED ORDER — SALINE SPRAY 0.65 % NA SOLN: 1.0000 | NASAL | Status: DC | PRN

## 2012-12-26 MED ORDER — AMLODIPINE BESYLATE 5 MG PO TABS: 5.0000 mg | ORAL_TABLET | Freq: Every day | ORAL | Status: DC

## 2012-12-26 MED ORDER — FLUTICASONE PROPIONATE 50 MCG/ACT NA SUSP: 2.0000 | Freq: Every day | NASAL | Status: DC

## 2012-12-26 MED ORDER — PANTOPRAZOLE SODIUM 40 MG PO TBEC: 40.0000 mg | DELAYED_RELEASE_TABLET | Freq: Every day | ORAL | Status: DC

## 2012-12-26 NOTE — Discharge Summary (Addendum)
Physician Discharge Summary  [name] [name] ZOX:[id number] DOB: 12-Apr-1946 DOA: 12/21/2012  PCP: Provider Not In System  Admit date: 12/21/2012 Discharge date: 12/26/2012  Recommendations for Outpatient Follow-up:  1. Follow up with primary care doctor in 1 week for reevaluation of abscess and respiratory exam 2. Follow up with [facility] in 2-3 weeks if not resolved 3. Pulmonary critical care within 2 weeks of discharge 4. Continue antibiotics for 14 day total course  Discharge Diagnoses:  Principal Problem:   MRSA cellulitis Active Problems:   COPD (chronic obstructive pulmonary disease)   PTSD (post-traumatic stress disorder)   Hyponatremia   Axillary abscess   COPD exacerbation   Elevated blood pressure  Discharge Condition: stable, improved  Diet recommendation: healthy heart  Wt Readings from Last 3 Encounters:  12/22/12 76.8 kg (169 lb 5 oz)  12/02/12 76.8 kg (169 lb 5 oz)    History of present illness:   [name] [name] is a 67 y.o. male who was just recently discharged from hospital after being read for COPD presents with complaints of persistent left-sided axillary pain with subjective feeling of fever chills and shortness of breath. Patient states since he got discharged shortness of breath slowly progressive worsened. Last one week patient has been having increasing pain in the left axilla with redness. In the ER patient was found to have an axillary abscess which was incised and drained and at this time since the area involved is large is admitted for IV antibiotics. In addition patient also is wheezing but not acutely in distress.   Hospital Course:   MRSA left axillary cellulitis and abscess.  Abscess was I&D'd in the emergency department and patient was started on vancomycin, zosyn for empiric treatment while awaiting wound culture.  His cellulitis gradually improved and receded from the left parasternal area to the anterior axillary line.  His antibiotics  were narrowed to monotherapy vancomycin when his culture grew Staph and he continued to show improvement with less induration and drainage.  He developed a firm area just anterior to the opening of the abscess, which was evaluated by surgery but felt to not be a loculated pocket.  He should continue warm compresses at home and dressing changes twice daily or more frequently if needed and follow up with his primary care doctor within 1 week for an exam.  Rx for clindamycin to complete a 14-day course.    Acute COPD exacerbation.  Patient has history of over 100-pack-year tobacco; quit smoking in January.  Ddx of wheezing includes medication side effect, GERD, sinusitis/allergic rhinitis.  He was started on solumedrol q6h and weaned to prednisone to complete a slow taper as he has required frequent steroids.  He was given duonebs as an inpatient with as needed albuterol, however, he may resume spiriva and symbicort.  He should start protonix, flonase, and nasal saline.  He should follow up with pulmonology within 2 weeks of discharge.  Consider CT sinuses if pt has persistent sinus congestion.    Elevated blood pressure, may be due to steroid use, but has persisted during his recent admissions.  He was started on norvasc 5mg  daily and should have a repeat BP check by PCP as outpatient.  Avoided BB and ACEI/ARB in setting of severe COPD.    Hyperglycemia, Steroid-induced.  Hemoglobin A1c 4.9.  He was given SSI to optimize fingersticks in the setting of infection.  PTSD stable.  Continued home medications.   Consultants:    General surgery, Dr. Gerrit Friends Procedures:  Bedside  I&D 12/22/2012 Antibiotics:  Vancomycin 2/25 >> 3/1 Zosyn 12/22/2012 >> 2/27   Discharge Exam: Filed Vitals:   12/26/12 0453  BP: 140/75  Pulse: 92  Temp: 97.5 F (36.4 C)  Resp: 18   Filed Vitals:   12/26/12 0210 12/26/12 0453 12/26/12 0455 12/26/12 0824  BP:  140/75    Pulse:  92    Temp:  97.5 F (36.4 C)     TempSrc:  Oral    Resp:  18    Height:      Weight:      SpO2: 98% 98% 97% 98%   General: Pt is alert. No acute distress  HEENT: No icterus, No thrush, [place]/AT  Cardiovascular: RRR, S1/S2, no rubs, no gallops  Respiratory:  Bilateral full expiratory wheeze, diminished at bases. No rhonchi or focal rales.  Abdomen: Soft/+BS, non tender, non distended, no guarding  Extremities: left axilla with 1-2 mL purulent drainage expressed. Erythema is less red with less distinct border and extending to the anterior axillary line. Induration much softer and isolated to axilla.    Discharge Instructions      Discharge Orders   Future Orders Complete By Expires     Call MD for:  difficulty breathing, headache or visual disturbances  As directed     Call MD for:  extreme fatigue  As directed     Call MD for:  hives  As directed     Call MD for:  persistant dizziness or light-headedness  As directed     Call MD for:  persistant nausea and vomiting  As directed     Call MD for:  redness, tenderness, or signs of infection (pain, swelling, redness, odor or green/yellow discharge around incision site)  As directed     Call MD for:  severe uncontrolled pain  As directed     Call MD for:  temperature >100.4  As directed     Diet - low sodium heart healthy  As directed     Discharge instructions  As directed     Comments:      You were hospitalized with a COPD exacerbation and an abscess under your left arm.  Your abscess was drained on 12/22/12.  You were started on IV antibiotics and should complete a 14 day course with clindamycin.  Clindamycin causes more frequent and softer stools, but if you have watery diarrhea > 4 times per day, then call your doctor as this may be a sign of C. Diff diarrhea, an infectious form of diarrhea that needs to be treated with additional antibiotic.  Follow up with your primary care doctor in less than 1 week for a repeat exam of your abscess.  If it is not improving, you may  follow up at the general surgery clinic in 2 weeks.  Make sure you continue doing frequent warm compresses under your arm and milking the infection out several times per day.  For your COPD, please take prednisone as a slow taper over the next few weeks.  You were started on nasal sprays and protonix to help minimize risk factors for wheezing.  Follow up with pulmonology within 2 weeks of discharge, preferably before stopping your steroids, for repeat exam.  Finally, your blood pressure was a little high so I started a medication for high blood pressure called amlodipine.  Please have your primary care doctor check your blood pressure to see if this medication needs to be continued or adjusted.    Discharge wound  care:  As directed     Comments:      Dry dressing/guaze applied with tape twice per day or more frequently as needed for drainage.    Increase activity slowly  As directed         Medication List    TAKE these medications       albuterol 108 (90 BASE) MCG/ACT inhaler  Commonly known as:  PROVENTIL HFA;VENTOLIN HFA  Inhale 2 puffs into the lungs every 6 (six) hours as needed.     albuterol (5 MG/ML) 0.5% nebulizer solution  Commonly known as:  PROVENTIL  Take 0.5 mLs (2.5 mg total) by nebulization every 4 (four) hours as needed for wheezing or shortness of breath.     amLODipine 5 MG tablet  Commonly known as:  NORVASC  Take 1 tablet (5 mg total) by mouth daily.     budesonide-formoterol 160-4.5 MCG/ACT inhaler  Commonly known as:  SYMBICORT  Inhale 2 puffs into the lungs 2 (two) times daily.     clindamycin 300 MG capsule  Commonly known as:  CLEOCIN  Take 2 capsules (600 mg total) by mouth 3 (three) times daily.     doxepin 50 MG capsule  Commonly known as:  SINEQUAN  Take 50 mg by mouth at bedtime.     fluticasone 50 MCG/ACT nasal spray  Commonly known as:  FLONASE  Place 2 sprays into the nose daily.     guaiFENesin-dextromethorphan 100-10 MG/5ML syrup  Commonly  known as:  ROBITUSSIN DM  Take 5 mLs by mouth every 4 (four) hours as needed for cough.     pantoprazole 40 MG tablet  Commonly known as:  PROTONIX  Take 1 tablet (40 mg total) by mouth daily.     predniSONE 10 MG tablet  Commonly known as:  DELTASONE  Take 6 tabs daily x 2 days, 5 tabs x 2 days, 4 tabs x 2 days, 3 tabs x 2 days, 2 tabs x 2 days, 1 tab x 2 days, then stop.     sertraline 100 MG tablet  Commonly known as:  ZOLOFT  Take 100 mg by mouth daily.     sodium chloride 0.65 % Soln nasal spray  Commonly known as:  OCEAN  Place 1 spray into the nose as needed for congestion.     tiotropium 18 MCG inhalation capsule  Commonly known as:  SPIRIVA  Place 18 mcg into inhaler and inhale daily.     zolpidem 10 MG tablet  Commonly known as:  AMBIEN  Take 10 mg by mouth at bedtime.       Follow-up Information   Follow up with Provider Not In System. Schedule an appointment as soon as possible for a visit in 1 week. (abscess follow up)       Follow up with [facility] In 2 weeks. (As needed if abscess not improving)    Contact information:   [address] [place] [address] [phone]      Follow up with [facility]. Schedule an appointment as soon as possible for a visit in 2 weeks.   Contact information:   [address] [place] [address] [phone]      The results of significant diagnostics from this hospitalization (including imaging, microbiology, ancillary and laboratory) are listed below for reference.    Significant Diagnostic Studies: Ct Chest W Contrast  12/23/2012  *RADIOLOGY REPORT*  Clinical Data: Evaluate left axillary abscess.  Drain 12/21/2012. Left axillary pain.  "Known lung nodule."  CT CHEST WITH CONTRAST  Technique:  Multidetector CT imaging of the chest was performed following the standard protocol during bolus administration of intravenous contrast.  Contrast:  100  ml Omnipaque-300   Comparison: Plain films of 12/21/2012 and 12/01/2012.  No prior CT.  Findings: Lungs/pleura: Secretions within the trachea.  Moderate centrilobular emphysema.  Biapical pleural parenchymal scarring. Right upper lobe lung nodule which measures 6 mm on image 33/series 5.  3 mm nodule on the right minor fissure on image 43/series 5. No pleural fluid.  Heart/Mediastinum: There is edema about the anterior left chest wall and left axilla.  Overlying skin thickening, primarily within the axilla.  Focal skin thickening / irregularity about the axilla on image 28/series 2.  Small left axillary and supraclavicular nodes.  No drainable fluid collection.  Edema within the left pectoralis musculature is also likely infectious/inflammatory. Example image 20/series 2.  Aortic and branch vessel atherosclerosis, without aneurysm. Normal heart size, without pericardial effusion.  Multivessel coronary artery atherosclerosis.  No mediastinal or hilar adenopathy. Surgical changes at the gastroesophageal junction.  Upper abdomen:  No significant findings.  Bones/Musculoskeletal:  No acute osseous abnormality.  IMPRESSION:  1.  Findings most consistent with cellulitis and possible myositis involving the left axilla and adjacent left chest wall.  Prominent adjacent lymph nodes, likely secondary.  No evidence of residual or recurrent drainable abscess. 2.   6 mm right upper lobe lung nodule. Given the concurrent centrilobular emphysema, follow-up chest CT at 6 - 12 months is recommended.  This recommendation follows the consensus statement: "Guidelines for Management of Small Pulmonary Nodules Detected on CT Scans: A Statement from the Fleischner Society" as published in Radiology 2005; 237:395-400. Online at: DietDisorder.cz. 3.  Coronary artery atherosclerosis.   Original Report Authenticated By: Jeronimo Greaves, M.D.    Dg Chest Portable 1 View  12/21/2012  *RADIOLOGY REPORT*  Clinical Data:  Shortness of breath.  PORTABLE CHEST - 1 VIEW  Comparison: 12/01/2012  Findings: Normal heart size and pulmonary vascularity. Emphysematous changes and scattered fibrosis in the lungs.  No focal consolidation or airspace disease.  No blunting of costophrenic angles.  No pneumothorax.  Mediastinal contours appear intact.  Calcification of the aorta.  No significant change since previous study.  IMPRESSION: Emphysematous changes in the lungs.  No evidence of active pulmonary disease.   Original Report Authenticated By: Burman Nieves, M.D.    Dg Chest Port 1 View  12/01/2012  *RADIOLOGY REPORT*  Clinical Data: Shortness of breath.  COPD.  PORTABLE CHEST - 1 VIEW  Comparison: PA and lateral chest 12/18/2008.  Findings: The chest is hyperexpanded but the lungs are clear. Heart size is normal.  No pneumothorax or pleural fluid. Surgical clips at the gastroesophageal junction are again seen.  IMPRESSION: Hyperexpansion compatible with emphysema.  No acute abnormality.   Original Report Authenticated By: Holley Dexter, M.D.     Microbiology: Recent Results (from the past 240 hour(s))  CULTURE, BLOOD (ROUTINE X 2)     Status: None   Collection Time    12/21/12 10:35 PM      Result Value Range Status   Specimen Description BLOOD LEFT ARM   Final   Special Requests BOTTLES DRAWN AEROBIC AND ANAEROBIC 5CC   Final   Culture  Setup Time 12/22/2012 05:23   Final   Culture     Final   Value:        BLOOD CULTURE RECEIVED NO  GROWTH TO DATE CULTURE WILL BE HELD FOR 5 DAYS BEFORE ISSUING A FINAL NEGATIVE REPORT   Report Status PENDING   Incomplete  CULTURE, BLOOD (ROUTINE X 2)     Status: None   Collection Time    12/22/12  2:33 AM      Result Value Range Status   Specimen Description BLOOD LEFT ANTECUBITAL   Final   Special Requests BOTTLES DRAWN AEROBIC AND ANAEROBIC 5CC   Final   Culture  Setup Time 12/22/2012 09:01   Final   Culture     Final   Value:        BLOOD CULTURE RECEIVED NO GROWTH TO DATE  CULTURE WILL BE HELD FOR 5 DAYS BEFORE ISSUING A FINAL NEGATIVE REPORT   Report Status PENDING   Incomplete  CULTURE, ROUTINE-ABSCESS     Status: None   Collection Time    12/22/12 12:26 PM      Result Value Range Status   Specimen Description ABSCESS LT ARM AXILLA   Final   Special Requests Normal   Final   Gram Stain     Final   Value: NO WBC SEEN     FEW SQUAMOUS EPITHELIAL CELLS PRESENT     RARE GRAM POSITIVE COCCI IN CLUSTERS     IN PAIRS   Culture     Final   Value: MODERATE METHICILLIN RESISTANT STAPHYLOCOCCUS AUREUS     Note: RIFAMPIN AND GENTAMICIN SHOULD NOT BE USED AS SINGLE DRUGS FOR TREATMENT OF STAPH INFECTIONS. This organism DOES NOT demonstrate inducible Clindamycin resistance in vitro. CRITICAL RESULT CALLED TO, READ BACK BY AND VERIFIED WITH: ANNIE HODGES      12/25/12 0955 BY SMITHERSJ   Report Status 12/25/2012 FINAL   Final   Organism ID, Bacteria METHICILLIN RESISTANT STAPHYLOCOCCUS AUREUS   Final     Labs: Basic Metabolic Panel:  Recent Labs Lab 12/22/12 0233 12/23/12 0452 12/24/12 0509 12/25/12 0430 12/26/12 0502  NA 128* 134* 137 137 139  K 3.6 4.2 3.5 3.8 3.7  CL 92* 98 99 98 99  CO2 27 28 28 30 31   GLUCOSE 111* 193* 185* 157* 94  BUN 14 13 12 12 14   CREATININE 0.99 0.82 0.76 0.71 0.84  CALCIUM 8.7 8.8 8.6 8.9 8.6   Liver Function Tests:  Recent Labs Lab 12/21/12 2158  AST 8  ALT 11  ALKPHOS 61  BILITOT 1.5*  PROT 6.8  ALBUMIN 2.7*   No results found for this basename: LIPASE, AMYLASE,  in the last 168 hours No results found for this basename: AMMONIA,  in the last 168 hours CBC:  Recent Labs Lab 12/21/12 2158 12/22/12 0233 12/23/12 0452 12/24/12 0509 12/25/12 0430 12/26/12 0502  WBC 17.4* 14.0* 11.7* 10.9* 9.2 10.1  NEUTROABS 14.9*  --  11.0*  --   --   --   HGB 12.9* 12.2* 10.8* 10.0* 11.0* 12.7*  HCT 37.1* 35.4* 31.1* 29.9* 33.3* 38.9*  MCV 84.3 85.1 84.7 85.9 86.5 87.2  PLT 193 174 177 195 202 228   Cardiac  Enzymes:  Recent Labs Lab 12/21/12 2158  TROPONINI <0.30   BNP: BNP (last 3 results)  Recent Labs  12/01/12 1550 12/21/12 2158  PROBNP 456.5* 415.7*   CBG:  Recent Labs Lab 12/25/12 1230 12/25/12 1736 12/25/12 2121 12/26/12 0745 12/26/12 1144  GLUCAP 229* 153* 162* 84 105*    Time coordinating discharge: 45 minutes  Signed:  Mariell Nester  [facility] 12/26/2012, 12:51 PM

## 2012-12-26 NOTE — Progress Notes (Addendum)
ANTIBIOTIC CONSULT NOTE - FOLLOW UP  Pharmacy Consult for Vanco Indication: Cellulitis/axillary abscess  No Known Allergies  Patient Measurements: Height: 5' 8.11" (173 cm) Weight: 169 lb 5 oz (76.8 kg) IBW/kg (Calculated) : 68.65  Vital Signs: Temp: 97.5 F (36.4 C) (03/01 0453) Temp src: Oral (03/01 0453) BP: 140/75 mmHg (03/01 0453) Pulse Rate: 92 (03/01 0453)  Intake/Output from previous day: 02/28 0701 - 03/01 0700 In: 680 [P.O.:480; IV Piggyback:200] Out: 850 [Urine:850]  Labs:  Recent Labs  12/24/12 0509 12/25/12 0430 12/26/12 0502  WBC 10.9* 9.2 10.1  HGB 10.0* 11.0* 12.7*  PLT 195 202 228  CREATININE 0.76 0.71 0.84   Estimated Creatinine Clearance: 82.9 ml/min (by C-G formula based on Cr of 0.84).  Recent Labs  12/24/12 1029  VANCOTROUGH 9.4*     Microbiology: 2/25 blood x2: NGTD 2/25 abscess: moderate S. aureus  Anti-infectives   Start     Dose/Rate Route Frequency Ordered Stop   12/22/12 1100  vancomycin (VANCOCIN) IVPB 1000 mg/200 mL premix     1,000 mg 200 mL/hr over 60 Minutes Intravenous Every 12 hours 12/22/12 0227     12/22/12 0300  piperacillin-tazobactam (ZOSYN) IVPB 3.375 g  Status:  Discontinued     3.375 g 12.5 mL/hr over 240 Minutes Intravenous Every 8 hours 12/22/12 0227 12/24/12 0739   12/21/12 2300  vancomycin (VANCOCIN) IVPB 1000 mg/200 mL premix     1,000 mg 200 mL/hr over 60 Minutes Intravenous  Once 12/21/12 2214 12/22/12 0003      Assessment: 67 yo M on Day #4 Vanco 1g IV q12h for L axillary abscess/cellulitis. Wound Cx (obtained after abx started) is currently growing moderate S. aureus. Zosyn has been d/c'd by MD this am. MD note indicates slow improvement and the presences of purulent drainage. S/p I&D  2/24 >> Vanco >> 2/25>> Zosyn >> 2/27  Tmax: AFeb WBCs: 10.1 Renal: SCr=0.84, CrCl 87 CG, 91 N  2/25 blood: NGTD 2/25 abscess: moderate MRSA  Dose changes/drug level info:  2/27: VT 9.4 on 1 g q12h. Did not  increase dosage given clinical improvement and risk for nephrotoxicity with aggressive dosing in older pts.  Goal of Therapy:  Vancomycin trough level 10-15 mcg/ml  Plan:   Vancomycin 1gm IV q12h remains appropriate  Possible change to PO clindamycin today  Juliette Alcide, PharmD, BCPS.   Pager: [phone] 12/26/2012,11:56 AM

## 2012-12-26 NOTE — Consult Note (Signed)
General Surgery [facility]  Patient seen as a courtesy visit for hospitalist service.  Patient admitted with left axillary abscess and cellulitis.  Culture shows MRSA.  I&D performed 3 days ago in ER.  On IV abx.  Wound in left axilla is open and draining.  Wound is small, but there does not appear to be undrained pus at this time.  No further surgical drainage is presently required.  Plan for continued abx therapy as out-patient.  If wound does not resolve over next 2-3 weeks of therapy, or if abscess reforms, then patient should come to [facility] office for evaluation or return to emergency department.  Plans for discharge and home care per hospitalist.  Velora Heckler, MD, [facility] Office: [phone]

## 2012-12-26 NOTE — Progress Notes (Signed)
SATURATION QUALIFICATIONS: (This note is used to comply with regulatory documentation for home oxygen)  Patient Saturations on Room Air at Rest = 99%  Patient Saturations on Room Air while Ambulating = 97%   

## 2012-12-28 LAB — CULTURE, BLOOD (ROUTINE X 2)
Culture: NO GROWTH
Culture: NO GROWTH

## 2013-03-29 ENCOUNTER — Emergency Department (HOSPITAL_COMMUNITY)
Admission: EM | Admit: 2013-03-29 | Discharge: 2013-03-29 | Disposition: A | Discharge status: Home / Self Care | Payer: Medicare Other | Attending: Emergency Medicine | Admitting: Emergency Medicine

## 2013-03-29 ENCOUNTER — Encounter (HOSPITAL_COMMUNITY): Payer: Self-pay | Admitting: *Deleted

## 2013-03-29 ENCOUNTER — Emergency Department (HOSPITAL_COMMUNITY): Payer: Medicare Other

## 2013-03-29 DIAGNOSIS — Z8709 Personal history of other diseases of the respiratory system: Secondary | ICD-10-CM | POA: Insufficient documentation

## 2013-03-29 DIAGNOSIS — R5381 Other malaise: Secondary | ICD-10-CM | POA: Insufficient documentation

## 2013-03-29 DIAGNOSIS — R059 Cough, unspecified: Secondary | ICD-10-CM | POA: Insufficient documentation

## 2013-03-29 DIAGNOSIS — J441 Chronic obstructive pulmonary disease with (acute) exacerbation: Secondary | ICD-10-CM

## 2013-03-29 DIAGNOSIS — Z8701 Personal history of pneumonia (recurrent): Secondary | ICD-10-CM | POA: Insufficient documentation

## 2013-03-29 DIAGNOSIS — IMO0002 Reserved for concepts with insufficient information to code with codable children: Secondary | ICD-10-CM | POA: Insufficient documentation

## 2013-03-29 DIAGNOSIS — R05 Cough: Secondary | ICD-10-CM | POA: Insufficient documentation

## 2013-03-29 DIAGNOSIS — I10 Essential (primary) hypertension: Secondary | ICD-10-CM | POA: Insufficient documentation

## 2013-03-29 DIAGNOSIS — R5383 Other fatigue: Secondary | ICD-10-CM | POA: Insufficient documentation

## 2013-03-29 DIAGNOSIS — F17200 Nicotine dependence, unspecified, uncomplicated: Secondary | ICD-10-CM | POA: Insufficient documentation

## 2013-03-29 DIAGNOSIS — Z79899 Other long term (current) drug therapy: Secondary | ICD-10-CM | POA: Insufficient documentation

## 2013-03-29 DIAGNOSIS — R509 Fever, unspecified: Secondary | ICD-10-CM | POA: Insufficient documentation

## 2013-03-29 DIAGNOSIS — F4310 Post-traumatic stress disorder, unspecified: Secondary | ICD-10-CM | POA: Insufficient documentation

## 2013-03-29 HISTORY — DX: Essential (primary) hypertension: I10

## 2013-03-29 LAB — COMPREHENSIVE METABOLIC PANEL
ALT: 10 U/L (ref 0–53)
Alkaline Phosphatase: 72 U/L (ref 39–117)
CO2: 24 mEq/L (ref 19–32)
Chloride: 97 mEq/L (ref 96–112)
GFR calc Af Amer: >90 mL/min (ref >90)
Glucose, Bld: 136 mg/dL — ABNORMAL HIGH (ref 70–99)
Potassium: 3.6 mEq/L (ref 3.5–5.1)
Sodium: 132 mEq/L — ABNORMAL LOW (ref 135–145)
Total Bilirubin: 0.3 mg/dL (ref 0.3–1.2)
Total Protein: 7 g/dL (ref 6.0–8.3)

## 2013-03-29 LAB — BLOOD GAS, ARTERIAL
Acid-Base Excess: 0.6 mmol/L (ref 0.0–2.0)
TCO2: 21.5 mmol/L (ref 0–100)
pCO2 arterial: 38.9 mmHg (ref 35.0–45.0)
pO2, Arterial: 59.1 mmHg — ABNORMAL LOW (ref 80.0–100.0)

## 2013-03-29 LAB — CBC WITH DIFFERENTIAL/PLATELET
Eosinophils Absolute: 0 10*3/uL (ref 0.0–0.7)
Hemoglobin: 13.2 g/dL (ref 13.0–17.0)
Lymphocytes Relative: 5 % — ABNORMAL LOW (ref 12–46)
Lymphs Abs: 0.4 10*3/uL — ABNORMAL LOW (ref 0.7–4.0)
Neutro Abs: 7.4 10*3/uL (ref 1.7–7.7)
Neutrophils Relative %: 91 % — ABNORMAL HIGH (ref 43–77)
Platelets: 125 10*3/uL — ABNORMAL LOW (ref 150–400)
RBC: 4.2 MIL/uL — ABNORMAL LOW (ref 4.22–5.81)
WBC: 8.1 10*3/uL (ref 4.0–10.5)

## 2013-03-29 MED ORDER — LEVOFLOXACIN IN D5W 500 MG/100ML IV SOLN
500.0000 mg | Freq: Once | INTRAVENOUS | Status: AC
  Administered 2013-03-29: 500 mg via INTRAVENOUS
  Filled 2013-03-29: qty 100

## 2013-03-29 MED ORDER — ALBUTEROL SULFATE (5 MG/ML) 0.5% IN NEBU
5.0000 mg | INHALATION_SOLUTION | Freq: Once | RESPIRATORY_TRACT | Status: AC
  Administered 2013-03-29: 5 mg via RESPIRATORY_TRACT
  Filled 2013-03-29: qty 1

## 2013-03-29 MED ORDER — LEVOFLOXACIN 500 MG PO TABS: 500.0000 mg | ORAL_TABLET | Freq: Every day | ORAL | Status: DC

## 2013-03-29 MED ORDER — ALBUTEROL SULFATE HFA 108 (90 BASE) MCG/ACT IN AERS
1.0000 | INHALATION_SPRAY | Freq: Once | RESPIRATORY_TRACT | Status: AC
  Administered 2013-03-29: 1 via RESPIRATORY_TRACT
  Filled 2013-03-29: qty 6.7

## 2013-03-29 MED ORDER — PREDNISONE 20 MG PO TABS: 40.0000 mg | ORAL_TABLET | Freq: Every day | ORAL | Status: DC

## 2013-03-29 MED ORDER — ALBUTEROL (5 MG/ML) CONTINUOUS INHALATION SOLN
10.0000 mg/h | INHALATION_SOLUTION | RESPIRATORY_TRACT | Status: DC
  Administered 2013-03-29: 10 mg/h via RESPIRATORY_TRACT
  Filled 2013-03-29: qty 20

## 2013-03-29 NOTE — ED Notes (Signed)
RT notified of pt needing breathing tx

## 2013-03-29 NOTE — ED Notes (Signed)
RT made aware of need for abg and hour long neb

## 2013-03-29 NOTE — ED Notes (Signed)
ZOX:[id number]<AV> Expected date:<BR> Expected time:<BR> Means of arrival:<BR> Comments:<BR> SOB

## 2013-03-29 NOTE — ED Notes (Signed)
rx x 2 given for levaquin and prednisone- family present to drive

## 2013-03-29 NOTE — ED Notes (Signed)
MD at bedside. 

## 2013-03-29 NOTE — ED Provider Notes (Signed)
History     CSN: [id number]  Arrival date & time 03/29/13  1324   First MD Initiated Contact with Patient 03/29/13 1928      Chief Complaint  Patient presents with  . Shortness of Breath    (Consider location/radiation/quality/duration/timing/severity/associated sxs/prior treatment) HPI Pt with multiple visists to the ED over the past few months for COPD exacerbation presents with acute worsening of wheezing and cough for the past 2-3 days. +subjective fever. +SOB. States he has been using home nebs without relief. No lower ext swelling or pain. No chest pain. Given 125 mg solumedrol and continuous neb by EMS en route with some improvement in pt's symptoms.  Past Medical History  Diagnosis Date  . COPD (chronic obstructive pulmonary disease)   . Asthma   . Emphysema   . PTSD (post-traumatic stress disorder)   . Tobacco abuse   . Pneumonia   . Hypertension     Past Surgical History  Procedure Laterality Date  . Hernia repair      hiatal  . Wisdom tooth extraction      Family History  Problem Relation Age of Onset  . Asthma Cousin   . Asthma Cousin   . Diabetes Brother   . Diabetes Mother     History  Substance Use Topics  . Smoking status: Current Every Day Smoker -- 2.00 packs/day for 55 years    Types: Cigarettes  . Smokeless tobacco: Never Used  . Alcohol Use: 50.4 oz/week    84 Cans of beer per week      Review of Systems  Constitutional: Positive for fever. Negative for chills.  Respiratory: Positive for cough, shortness of breath and wheezing. Negative for chest tightness.   Cardiovascular: Negative for chest pain, palpitations and leg swelling.  Gastrointestinal: Negative for nausea, vomiting and abdominal pain.  Musculoskeletal: Negative for back pain.  Skin: Negative for rash and wound.  Neurological: Positive for weakness. Negative for dizziness, tremors, light-headedness and headaches.  All other systems reviewed and are  negative.    Allergies  Review of patient's allergies indicates no known allergies.  Home Medications   Current Outpatient Rx  Name  Route  Sig  Dispense  Refill  . albuterol (PROVENTIL HFA;VENTOLIN HFA) 108 (90 BASE) MCG/ACT inhaler   Inhalation   Inhale 2 puffs into the lungs every 6 (six) hours as needed.         Marland Kitchen albuterol (PROVENTIL) (5 MG/ML) 0.5% nebulizer solution   Nebulization   Take 0.5 mLs (2.5 mg total) by nebulization every 4 (four) hours as needed for wheezing or shortness of breath.   20 mL   1   . amLODipine (NORVASC) 5 MG tablet   Oral   Take 1 tablet (5 mg total) by mouth daily.   30 tablet   1   . budesonide-formoterol (SYMBICORT) 160-4.5 MCG/ACT inhaler   Inhalation   Inhale 2 puffs into the lungs 2 (two) times daily.         Marland Kitchen doxepin (SINEQUAN) 50 MG capsule   Oral   Take 50 mg by mouth at bedtime.         . fluticasone (FLONASE) 50 MCG/ACT nasal spray   Nasal   Place 2 sprays into the nose daily.   16 g   1   . guaiFENesin-dextromethorphan (ROBITUSSIN DM) 100-10 MG/5ML syrup   Oral   Take 5 mLs by mouth every 4 (four) hours as needed for cough.   118 mL  1   . pantoprazole (PROTONIX) 40 MG tablet   Oral   Take 1 tablet (40 mg total) by mouth daily.   30 tablet   1   . sertraline (ZOLOFT) 100 MG tablet   Oral   Take 100 mg by mouth daily.         . sodium chloride (OCEAN) 0.65 % SOLN nasal spray   Nasal   Place 1 spray into the nose as needed for congestion.         Marland Kitchen tiotropium (SPIRIVA) 18 MCG inhalation capsule   Inhalation   Place 18 mcg into inhaler and inhale daily.         Marland Kitchen levofloxacin (LEVAQUIN) 500 MG tablet   Oral   Take 1 tablet (500 mg total) by mouth daily.   7 tablet   0   . predniSONE (DELTASONE) 10 MG tablet      Take 6 tabs daily x 2 days, 5 tabs x 2 days, 4 tabs x 2 days, 3 tabs x 2 days, 2 tabs x 2 days, 1 tab x 2 days, then stop.   42 tablet   0   . predniSONE (DELTASONE) 20 MG  tablet   Oral   Take 2 tablets (40 mg total) by mouth daily.   10 tablet   0     BP 130/63  Pulse 103  Temp(Src) 99.9 F (37.7 C) (Oral)  Resp 22  SpO2 98%  Physical Exam  Nursing note and vitals reviewed. Constitutional: He is oriented to person, place, and time. He appears well-developed and well-nourished. No distress.  HENT:  Head: Normocephalic and atraumatic.  Mouth/Throat: Oropharynx is clear and moist.  Eyes: EOM are normal. Pupils are equal, round, and reactive to light.  Neck: Normal range of motion. Neck supple.  Cardiovascular: Normal rate and regular rhythm.   Pulmonary/Chest: No respiratory distress. He has wheezes. He has no rales. He exhibits no tenderness.  Increased respiratory effort with wheezing on inspiration and expiration.   Abdominal: Soft. Bowel sounds are normal. He exhibits mass (umbilical and ventral hernia that are easily reduced). He exhibits no distension. There is no tenderness. There is no rebound and no guarding.  Musculoskeletal: Normal range of motion. He exhibits no edema and no tenderness.  No calf swelling or pain  Neurological: He is alert and oriented to person, place, and time.  Moves all ext without deficit, sensation grossly intact.   Skin: Skin is warm and dry. No rash noted. No erythema.  Psychiatric: He has a normal mood and affect. His behavior is normal.    ED Course  Procedures (including critical care time)  Labs Reviewed  CBC WITH DIFFERENTIAL - Abnormal; Notable for the following:    RBC 4.20 (*)    HCT 37.9 (*)    Platelets 125 (*)    Neutrophils Relative % 91 (*)    Lymphocytes Relative 5 (*)    Lymphs Abs 0.4 (*)    All other components within normal limits  COMPREHENSIVE METABOLIC PANEL - Abnormal; Notable for the following:    Sodium 132 (*)    Glucose, Bld 136 (*)    Albumin 2.9 (*)    GFR calc non Af Amer 84 (*)    All other components within normal limits  BLOOD GAS, ARTERIAL - Abnormal; Notable for the  following:    pO2, Arterial 59.1 (*)    Bicarbonate 24.5 (*)    All other components within normal limits  Dg Chest Port 1 View  03/29/2013   *RADIOLOGY REPORT*  Clinical Data: Shortness of breath and cough  PORTABLE CHEST - 1 VIEW  Comparison: 12/21/2012 similar exam, chest CT 12/23/2012  Findings: Emphysematous changes with coarsened pulmonary parenchymal markings again noted.  There is minimal superimposed bronchial wall thickening and areas of possible reticular nodular opacity over the bases, and less so the apices.  No pleural effusion.  Epigastric clips are noted.  Heart size is normal.  No acute osseous finding.  IMPRESSION: Emphysematous changes with superimposed reticular nodular opacities and bronchial wall thickening.  This may indicate small airways infection, including viral / atypical etiologies.   Original Report Authenticated By: Christiana Pellant, M.D.     1. COPD exacerbation      Date: 03/29/2013  Rate: 109  Rhythm: sinus tachycardia  QRS Axis: normal  Intervals: normal  ST/T Wave abnormalities: normal  Conduction Disutrbances:none  Narrative Interpretation:   Old EKG Reviewed: unchanged ocassional PVC's PAC's   MDM  Pt states he is at his baseline. Asking to be d/c'd. Will f/u in [place] tomorrow. Pt advised to return immediately for worsening SOB or any concerns.         Loren Racer, MD 03/29/13 2253

## 2013-03-29 NOTE — ED Notes (Signed)
EMS transport from home- hx of COPD and asthma- home nebs not working- 10 albuterol, 1mg  atrovent and 125mg  solumedrol given by EMS- piv 18 left ac-

## 2014-06-15 IMAGING — CR DG CHEST 1V PORT
1 series · 2 of 2 positions shown · non-contrast
Comparison: PA and lateral chest 12/18/2008.

CLINICAL DATA: Shortness of breath.  COPD.

PORTABLE CHEST - 1 VIEW

[Series 1: AP · U · 2 of 2 slices shown]
[im 1/2]
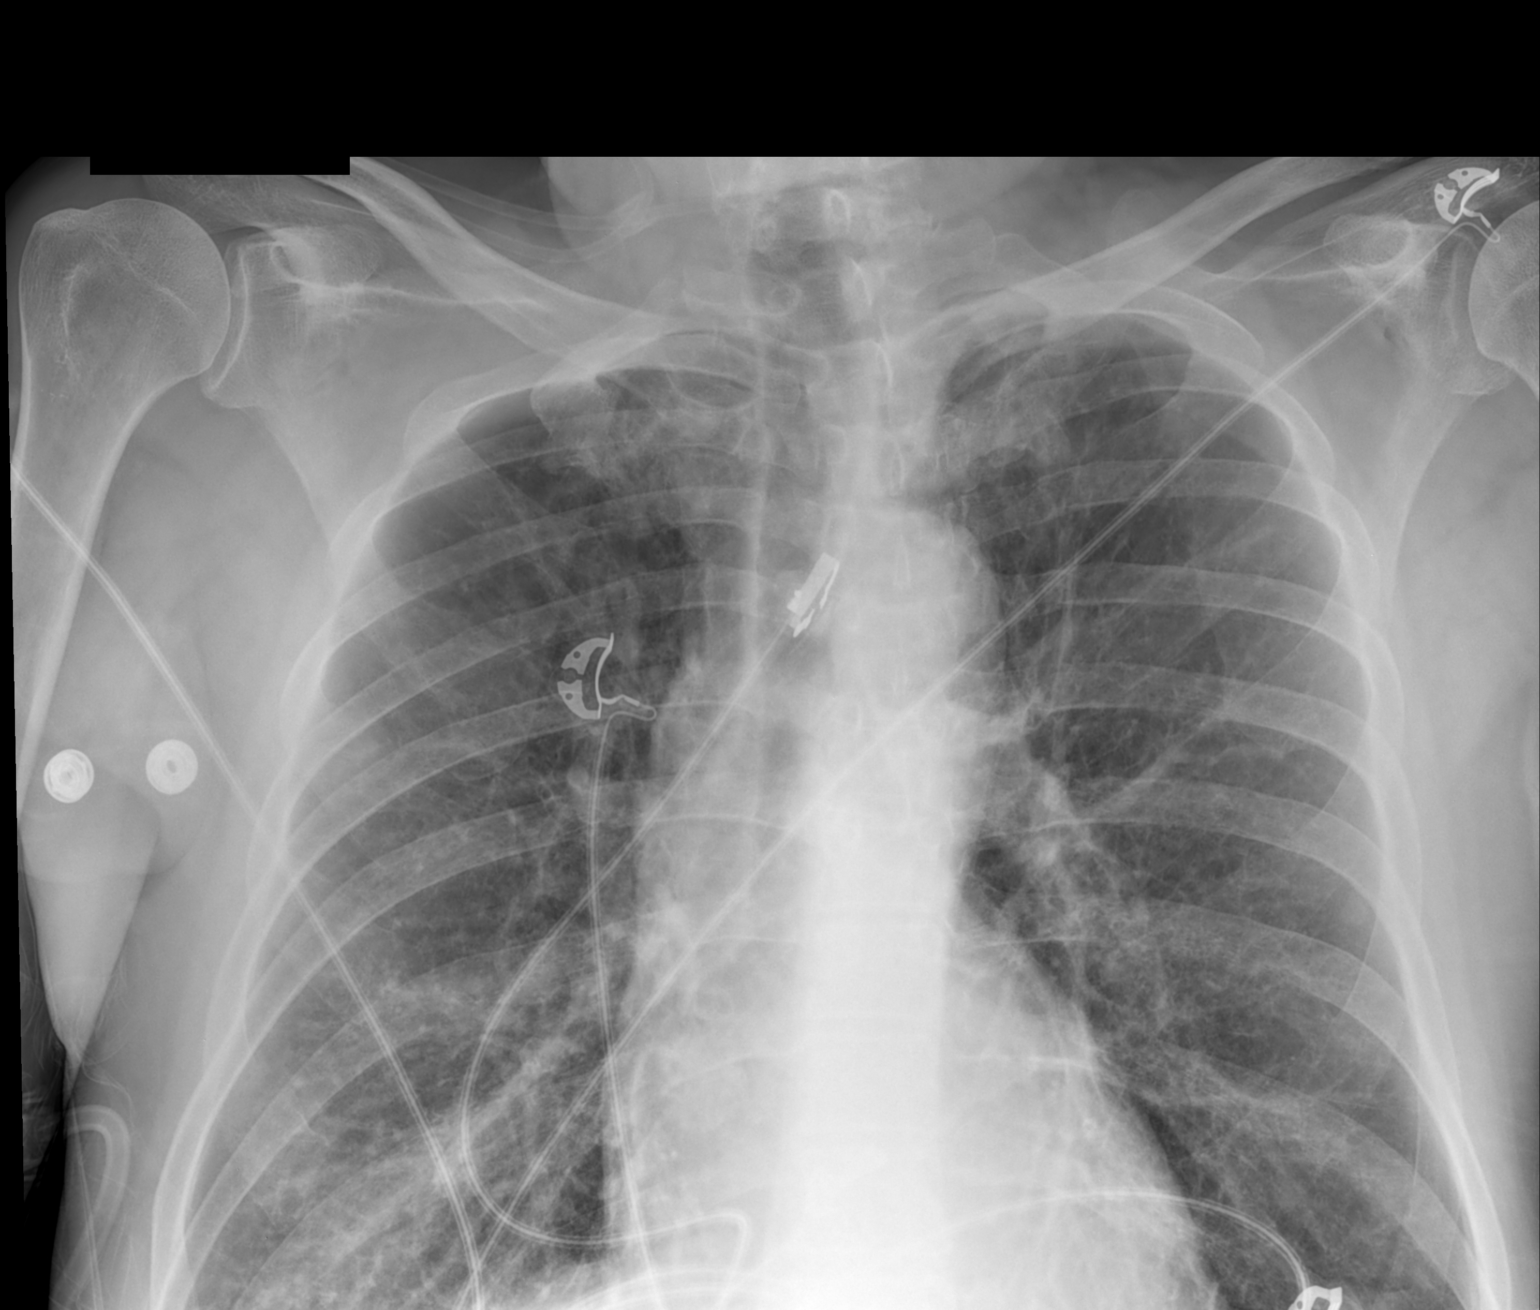
[im 2/2]
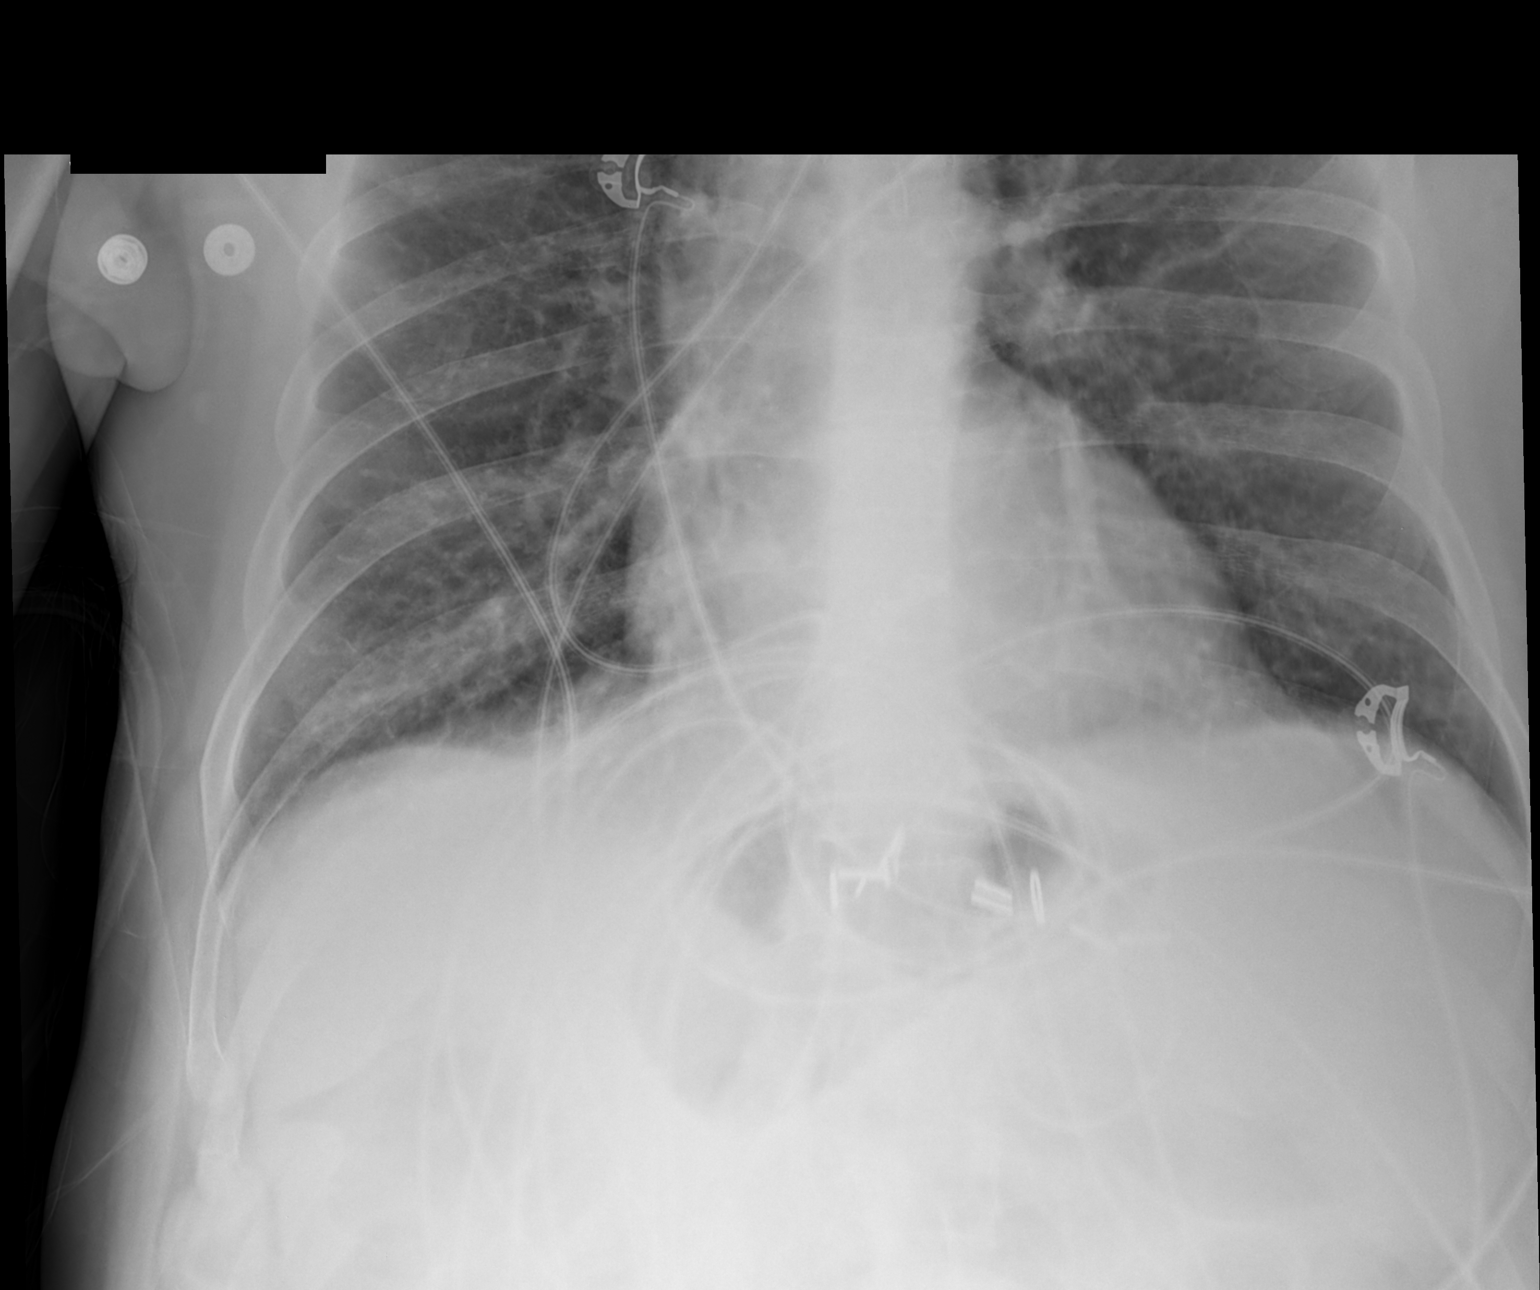

[2 of 2 positions shown; findings below may reference images not displayed]

FINDINGS: The chest is hyperexpanded but the lungs are clear. Heart
size is normal.  No pneumothorax or pleural fluid. Surgical clips
at the gastroesophageal junction are again seen.
IMPRESSION: Hyperexpansion compatible with emphysema.  No acute abnormality.

## 2015-05-09 ENCOUNTER — Encounter (HOSPITAL_COMMUNITY): Payer: Self-pay | Admitting: *Deleted

## 2015-05-09 ENCOUNTER — Emergency Department (HOSPITAL_COMMUNITY)
Admission: EM | Admit: 2015-05-09 | Discharge: 2015-05-10 | Disposition: A | Discharge status: Home / Self Care | Payer: Medicare Other | Attending: Psychiatry | Admitting: Psychiatry

## 2015-05-09 DIAGNOSIS — F911 Conduct disorder, childhood-onset type: Secondary | ICD-10-CM | POA: Diagnosis not present

## 2015-05-09 DIAGNOSIS — J449 Chronic obstructive pulmonary disease, unspecified: Secondary | ICD-10-CM | POA: Diagnosis not present

## 2015-05-09 DIAGNOSIS — I10 Essential (primary) hypertension: Secondary | ICD-10-CM | POA: Diagnosis not present

## 2015-05-09 DIAGNOSIS — F39 Unspecified mood [affective] disorder: Secondary | ICD-10-CM | POA: Diagnosis not present

## 2015-05-09 DIAGNOSIS — Z046 Encounter for general psychiatric examination, requested by authority: Secondary | ICD-10-CM | POA: Diagnosis present

## 2015-05-09 DIAGNOSIS — Z8701 Personal history of pneumonia (recurrent): Secondary | ICD-10-CM | POA: Diagnosis not present

## 2015-05-09 DIAGNOSIS — Z720 Tobacco use: Secondary | ICD-10-CM | POA: Diagnosis not present

## 2015-05-09 DIAGNOSIS — R45850 Homicidal ideations: Secondary | ICD-10-CM
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
## 2015-05-10 DIAGNOSIS — F39 Unspecified mood [affective] disorder: Secondary | ICD-10-CM | POA: Diagnosis present

## 2015-05-10 DIAGNOSIS — F911 Conduct disorder, childhood-onset type: Secondary | ICD-10-CM | POA: Diagnosis not present

## 2015-05-10 LAB — RAPID URINE DRUG SCREEN, HOSP PERFORMED
Amphetamines: NOT DETECTED
BARBITURATES: NOT DETECTED
Benzodiazepines: NOT DETECTED
Cocaine: NOT DETECTED
OPIATES: NOT DETECTED
TETRAHYDROCANNABINOL: NOT DETECTED

## 2015-05-10 LAB — COMPREHENSIVE METABOLIC PANEL
ALBUMIN: 4.3 g/dL (ref 3.5–5.0)
ALT: 26 U/L (ref 17–63)
ANION GAP: 13 (ref 5–15)
AST: 27 U/L (ref 15–41)
Alkaline Phosphatase: 85 U/L (ref 38–126)
BILIRUBIN TOTAL: 0.8 mg/dL (ref 0.3–1.2)
BUN: 9 mg/dL (ref 6–20)
CO2: 24 mmol/L (ref 22–32)
Calcium: 8.9 mg/dL (ref 8.9–10.3)
Chloride: 99 mmol/L — ABNORMAL LOW (ref 101–111)
Creatinine, Ser: 0.72 mg/dL (ref 0.61–1.24)
GLUCOSE: 99 mg/dL (ref 65–99)
POTASSIUM: 3.8 mmol/L (ref 3.5–5.1)
Sodium: 136 mmol/L (ref 135–145)
Total Protein: 7.5 g/dL (ref 6.5–8.1)

## 2015-05-10 LAB — CBC
HCT: 44.2 % (ref 39.0–52.0)
Hemoglobin: 15.5 g/dL (ref 13.0–17.0)
MCH: 33 pg (ref 26.0–34.0)
MCHC: 35.1 g/dL (ref 30.0–36.0)
MCV: 94.2 fL (ref 78.0–100.0)
Platelets: 191 10*3/uL (ref 150–400)
RBC: 4.69 MIL/uL (ref 4.22–5.81)
RDW: 13.8 % (ref 11.5–15.5)
WBC: 7.4 10*3/uL (ref 4.0–10.5)

## 2015-05-10 LAB — ETHANOL: Alcohol, Ethyl (B): 62 mg/dL — ABNORMAL HIGH (ref <5)

## 2015-05-10 LAB — ACETAMINOPHEN LEVEL

## 2015-05-10 LAB — SALICYLATE LEVEL: Salicylate Lvl: <4 mg/dL (ref 2.8–30.0)

## 2015-05-10 MED ORDER — FLUTICASONE PROPIONATE 50 MCG/ACT NA SUSP
2.0000 | Freq: Every day | NASAL | Status: DC
  Filled 2015-05-10: qty 16

## 2015-05-10 MED ORDER — ALBUTEROL SULFATE HFA 108 (90 BASE) MCG/ACT IN AERS: 1.0000 | INHALATION_SPRAY | RESPIRATORY_TRACT | Status: DC | PRN

## 2015-05-10 MED ORDER — AMLODIPINE BESYLATE 5 MG PO TABS
5.0000 mg | ORAL_TABLET | Freq: Every day | ORAL | Status: DC
  Administered 2015-05-10: 5 mg via ORAL
  Filled 2015-05-10 (×2): qty 1

## 2015-05-10 MED ORDER — PANTOPRAZOLE SODIUM 40 MG PO TBEC
40.0000 mg | DELAYED_RELEASE_TABLET | Freq: Every day | ORAL | Status: DC
  Filled 2015-05-10: qty 1

## 2015-05-10 MED ORDER — SERTRALINE HCL 100 MG PO TABS: 100.0000 mg | ORAL_TABLET | Freq: Every day | ORAL | Status: DC

## 2015-05-10 MED ORDER — ALBUTEROL SULFATE (2.5 MG/3ML) 0.083% IN NEBU
2.5000 mg | INHALATION_SOLUTION | RESPIRATORY_TRACT | Status: DC | PRN
  Administered 2015-05-10: 2.5 mg via RESPIRATORY_TRACT
  Filled 2015-05-10: qty 3

## 2015-05-10 MED ORDER — TIOTROPIUM BROMIDE MONOHYDRATE 18 MCG IN CAPS
18.0000 ug | ORAL_CAPSULE | Freq: Every day | RESPIRATORY_TRACT | Status: DC
  Administered 2015-05-10: 18 ug via RESPIRATORY_TRACT
  Filled 2015-05-10: qty 5

## 2015-05-10 MED ORDER — DOXEPIN HCL 50 MG PO CAPS
50.0000 mg | ORAL_CAPSULE | Freq: Every day | ORAL | Status: DC
  Filled 2015-05-10 (×2): qty 1

## 2015-05-10 MED ORDER — BUDESONIDE-FORMOTEROL FUMARATE 160-4.5 MCG/ACT IN AERO
2.0000 | INHALATION_SPRAY | Freq: Two times a day (BID) | RESPIRATORY_TRACT | Status: DC
  Administered 2015-05-10: 2 via RESPIRATORY_TRACT
  Filled 2015-05-10: qty 6

## 2015-05-10 MED ORDER — ALBUTEROL SULFATE HFA 108 (90 BASE) MCG/ACT IN AERS: 2.0000 | INHALATION_SPRAY | Freq: Four times a day (QID) | RESPIRATORY_TRACT | Status: DC | PRN

## 2015-05-10 MED ORDER — SERTRALINE HCL 50 MG PO TABS
100.0000 mg | ORAL_TABLET | Freq: Every day | ORAL | Status: DC
  Filled 2015-05-10: qty 2

## 2015-05-10 NOTE — BH Assessment (Signed)
[facility] Assessment Progress Note  Per Thedore MinsMojeed Akintayo, MD, this pt does not require psychiatric hospitalization at this time.  [facility].  IVC has been rescinded.  Pt [facility] at this [facility].  Discharge instructions advise him to continue this course of treatment.  Pt's nurse has been notified.  Doylene Canninghomas Karen Kinnard, MA Triage Specialist [phone]
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
## 2015-05-10 NOTE — Discharge Instructions (Signed)
For your ongoing behavioral health needs, you are advised to continue the treatment that you currently receive at [facility]:       [facility]      [address]      [place], [place] [id number]      [phone]

## 2015-05-10 NOTE — ED Notes (Signed)
Pt's property Youth workerinventoried  Wallet and money locked up in security office in lock box #4  Clothes, shoes, and keys at nursing station  Pt kept his cap and cane which are in room with pt
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
## 2015-05-11 DIAGNOSIS — R45850 Homicidal ideations: Secondary | ICD-10-CM | POA: Insufficient documentation

## 2015-05-11 DIAGNOSIS — Z046 Encounter for general psychiatric examination, requested by authority: Secondary | ICD-10-CM | POA: Insufficient documentation

## 2015-05-11 NOTE — BHH Suicide Risk Assessment (Cosign Needed)
Suicide Risk Assessment  Discharge Assessment   [facility]   Demographic Factors:  Male, Age 69 or older, Caucasian, Low socioeconomic status, Living alone and Unemployed  Total Time spent with patient: 20 minutes  Musculoskeletal: Strength & Muscle Tone: Seen sitting on his bed  Gait & Station: seen sitting on his bed Patient leans: see above  Psychiatric Specialty Exam:     Blood pressure 162/86, pulse 98, temperature 98 F (36.7 C), temperature source Oral, resp. rate 17, SpO2 98 %.There is no weight on file to calculate BMI.  General Appearance: Casual and Fairly Groomed  Patent attorneyye Contact::  Good  Speech:  Clear and Coherent and Normal Rate  Volume:  Normal  Mood:  Angry  Affect:  Congruent  Thought Process:  Coherent, Goal Directed and Intact  Orientation:  Full (Time, Place, and Person)  Thought Content:  WDL  Suicidal Thoughts:  No  Homicidal Thoughts:  No  Memory:  Immediate;   Good Recent;   Good Remote;   Good  Judgement:  Fair  Insight:  Good  Psychomotor Activity:  Normal  Concentration:  Good  Recall:  Fair  Fund of Knowledge:Good  Language: Good  Akathisia:  NA  Handed:  Right  AIMS (if indicated):     Assets:  Desire for Improvement  ADL's:  Intact  Cognition: WNL  Sleep:           Has this patient used any form of tobacco in the last 30 days? (Cigarettes, Smokeless Tobacco, Cigars, and/or Pipes) N/A  Mental Status Per Nursing Assessment::   On Admission:     Current Mental Status by Physician: NA  Loss Factors: NA  Historical Factors: NA  Risk Reduction Factors:   Religious beliefs about death and Positive social support  Continued Clinical Symptoms:  Previous Psychiatric Diagnoses and Treatments  Cognitive Features That Contribute To Risk:  Polarized thinking    Suicide Risk:  Minimal: No identifiable suicidal ideation.  Patients presenting with no risk factors but with morbid ruminations; may be  classified as minimal risk based on the severity of the depressive symptoms  Principal Problem: Mood disorder Discharge Diagnoses:  Patient Active Problem List   Diagnosis Date Noted  . Mood disorder [F39] 05/10/2015    Priority: High  . Homicidal ideation [R45.850]   . Involuntary commitment [Z04.6]   . Elevated blood pressure [R03.0] 12/25/2012  . MRSA cellulitis [L03.90, B95.62] 12/22/2012  . Hyponatremia [E87.1] 12/22/2012  . Axillary abscess [L02.419] 12/22/2012  . COPD exacerbation [J44.1] 12/22/2012  . Acute respiratory failure with hypoxia [J96.01] 12/01/2012  . Thrombocytopenia [D69.6] 12/01/2012  . Sinus tachycardia [I47.1] 12/01/2012  . Borderline diabetes [R73.09] 12/01/2012  . COPD (chronic obstructive pulmonary disease) [J44.9]   . PTSD (post-traumatic stress disorder) [F43.10]   . Tobacco abuse [Z72.0]       Plan Of Care/Follow-up recommendations:  Activity:  as tolerated Diet:  regular  Is patient on multiple antipsychotic therapies at discharge:  No   Has Patient had three or more failed trials of antipsychotic monotherapy by history:  No  Recommended Plan for Multiple Antipsychotic Therapies: NA    Ramata Strothman C 05/11/2015, 5:35 PM

## 2016-04-18 ENCOUNTER — Other Ambulatory Visit: Payer: Self-pay

## 2016-04-18 ENCOUNTER — Encounter: Payer: Self-pay | Admitting: Family Medicine

## 2016-04-18 ENCOUNTER — Ambulatory Visit (INDEPENDENT_AMBULATORY_CARE_PROVIDER_SITE_OTHER): Payer: Medicare Other | Admitting: Family Medicine

## 2016-04-18 VITALS — BP 171/93 | HR 115 | Temp 98.6°F | Ht 68.0 in | Wt 169.0 lb

## 2016-04-18 DIAGNOSIS — J410 Simple chronic bronchitis: Secondary | ICD-10-CM

## 2016-04-18 DIAGNOSIS — J4541 Moderate persistent asthma with (acute) exacerbation: Secondary | ICD-10-CM

## 2016-04-18 MED ORDER — ALBUTEROL SULFATE HFA 108 (90 BASE) MCG/ACT IN AERS: 2.0000 | INHALATION_SPRAY | Freq: Four times a day (QID) | RESPIRATORY_TRACT | Status: DC | PRN

## 2016-04-18 NOTE — Progress Notes (Signed)
S: 70 year old gentleman with history of COPD and asthma. [facility] but had run out of his albuterol, rescue inhaler. He had sudden onset of wheezing this morning while eating breakfast. He says he needs a neb treatment since he did not have the rescue inhaler at hand. He denies any chest pain cough or other symptoms that would suggest CHF.  O: Patient is having some respiratory distress but his oxygen saturation is 97% on room air. Chest there is air movement but bilateral wheezing Heart sounds are distant but seems regular  Patient given nebulizer treatment with DuoNeb. Reexam shows improved symptoms and subjectively patient feels much better. Prescription for albuterol inhaler was given when she can carry to local pharmacist rather than [facility].  AP: COPD with reversible component. Good response to bronchodilator treatment Use albuterol inhaler as needed  Frederica KusterStephen M Miller MD

## 2016-05-13 ENCOUNTER — Emergency Department (HOSPITAL_COMMUNITY)
Admission: EM | Admit: 2016-05-13 | Discharge: 2016-05-13 | Disposition: A | Discharge status: Home / Self Care | Payer: Medicare Other | Attending: Emergency Medicine | Admitting: Emergency Medicine

## 2016-05-13 ENCOUNTER — Encounter (HOSPITAL_COMMUNITY): Payer: Self-pay | Admitting: Nurse Practitioner

## 2016-05-13 ENCOUNTER — Emergency Department (HOSPITAL_COMMUNITY): Payer: Medicare Other

## 2016-05-13 DIAGNOSIS — J441 Chronic obstructive pulmonary disease with (acute) exacerbation: Secondary | ICD-10-CM | POA: Insufficient documentation

## 2016-05-13 DIAGNOSIS — Z7951 Long term (current) use of inhaled steroids: Secondary | ICD-10-CM | POA: Insufficient documentation

## 2016-05-13 DIAGNOSIS — R0602 Shortness of breath: Secondary | ICD-10-CM | POA: Diagnosis present

## 2016-05-13 DIAGNOSIS — I10 Essential (primary) hypertension: Secondary | ICD-10-CM | POA: Diagnosis not present

## 2016-05-13 DIAGNOSIS — J45909 Unspecified asthma, uncomplicated: Secondary | ICD-10-CM | POA: Insufficient documentation

## 2016-05-13 DIAGNOSIS — Z79899 Other long term (current) drug therapy: Secondary | ICD-10-CM | POA: Insufficient documentation

## 2016-05-13 DIAGNOSIS — F17210 Nicotine dependence, cigarettes, uncomplicated: Secondary | ICD-10-CM | POA: Insufficient documentation

## 2016-05-13 LAB — CBC WITH DIFFERENTIAL/PLATELET
BASOS ABS: 0 10*3/uL (ref 0.0–0.1)
Basophils Relative: 0 %
Eosinophils Absolute: 0.1 10*3/uL (ref 0.0–0.7)
Eosinophils Relative: 1 %
HEMATOCRIT: 42.5 % (ref 39.0–52.0)
HEMOGLOBIN: 14.6 g/dL (ref 13.0–17.0)
LYMPHS PCT: 12 %
Lymphs Abs: 0.9 10*3/uL (ref 0.7–4.0)
MCH: 32.9 pg (ref 26.0–34.0)
MCHC: 34.4 g/dL (ref 30.0–36.0)
MCV: 95.7 fL (ref 78.0–100.0)
MONO ABS: 0.2 10*3/uL (ref 0.1–1.0)
MONOS PCT: 3 %
NEUTROS ABS: 6.8 10*3/uL (ref 1.7–7.7)
NEUTROS PCT: 84 %
Platelets: 170 10*3/uL (ref 150–400)
RBC: 4.44 MIL/uL (ref 4.22–5.81)
RDW: 14.6 % (ref 11.5–15.5)
WBC: 8 10*3/uL (ref 4.0–10.5)

## 2016-05-13 LAB — BASIC METABOLIC PANEL
ANION GAP: 7 (ref 5–15)
BUN: 7 mg/dL (ref 6–20)
CALCIUM: 8.9 mg/dL (ref 8.9–10.3)
CHLORIDE: 102 mmol/L (ref 101–111)
CO2: 31 mmol/L (ref 22–32)
Creatinine, Ser: 0.69 mg/dL (ref 0.61–1.24)
GFR calc Af Amer: >60 mL/min (ref >60)
GFR calc non Af Amer: >60 mL/min (ref >60)
GLUCOSE: 111 mg/dL — AB (ref 65–99)
Potassium: 3.8 mmol/L (ref 3.5–5.1)
Sodium: 140 mmol/L (ref 135–145)

## 2016-05-13 MED ORDER — IPRATROPIUM BROMIDE 0.02 % IN SOLN
0.5000 mg | Freq: Once | RESPIRATORY_TRACT | Status: AC
  Administered 2016-05-13: 0.5 mg via RESPIRATORY_TRACT
  Filled 2016-05-13: qty 2.5

## 2016-05-13 MED ORDER — ALBUTEROL SULFATE HFA 108 (90 BASE) MCG/ACT IN AERS: 2.0000 | INHALATION_SPRAY | Freq: Four times a day (QID) | RESPIRATORY_TRACT | Status: AC | PRN

## 2016-05-13 MED ORDER — ALBUTEROL SULFATE (2.5 MG/3ML) 0.083% IN NEBU
5.0000 mg | INHALATION_SOLUTION | Freq: Once | RESPIRATORY_TRACT | Status: AC
  Administered 2016-05-13: 5 mg via RESPIRATORY_TRACT
  Filled 2016-05-13: qty 6

## 2016-05-13 MED ORDER — PREDNISONE 20 MG PO TABS
60.0000 mg | ORAL_TABLET | Freq: Once | ORAL | Status: AC
  Administered 2016-05-13: 60 mg via ORAL
  Filled 2016-05-13: qty 3

## 2016-05-13 MED ORDER — PREDNISONE 20 MG PO TABS: 40.0000 mg | ORAL_TABLET | Freq: Every day | ORAL | Status: AC

## 2016-05-13 MED ORDER — TIOTROPIUM BROMIDE MONOHYDRATE 18 MCG IN CAPS: 18.0000 ug | ORAL_CAPSULE | Freq: Every day | RESPIRATORY_TRACT | Status: AC

## 2016-05-13 MED ORDER — AMLODIPINE BESYLATE 10 MG PO TABS: 10.0000 mg | ORAL_TABLET | Freq: Every day | ORAL | Status: AC

## 2016-05-13 NOTE — Discharge Instructions (Signed)
Chronic Obstructive Pulmonary Disease Chronic obstructive pulmonary disease (COPD) is a common lung condition in which airflow from the lungs is limited. COPD is a general term that can be used to describe many different lung problems that limit airflow, including both chronic bronchitis and emphysema. If you have COPD, your lung function will probably never return to normal, but there are measures you can take to improve lung function and make yourself feel better. CAUSES   Smoking (common).  Exposure to secondhand smoke.  Genetic problems.  Chronic inflammatory lung diseases or recurrent infections. SYMPTOMS  Shortness of breath, especially with physical activity.  Deep, persistent (chronic) cough with a large amount of thick mucus.  Wheezing.  Rapid breaths (tachypnea).  Gray or bluish discoloration (cyanosis) of the skin, especially in your fingers, toes, or lips.  Fatigue.  Weight loss.  Frequent infections or episodes when breathing symptoms become much worse (exacerbations).  Chest tightness. DIAGNOSIS Your health care provider will take a medical history and perform a physical examination to diagnose COPD. Additional tests for COPD may include:  Lung (pulmonary) function tests.  Chest X-ray.  CT scan.  Blood tests. TREATMENT  Treatment for COPD may include:  Inhaler and nebulizer medicines. These help manage the symptoms of COPD and make your breathing more comfortable.  Supplemental oxygen. Supplemental oxygen is only helpful if you have a low oxygen level in your blood.  Exercise and physical activity. These are beneficial for nearly all people with COPD.  Lung surgery or transplant.  Nutrition therapy to gain weight, if you are underweight.  Pulmonary rehabilitation. This may involve working with a team of health care providers and specialists, such as respiratory, occupational, and physical therapists. HOME CARE INSTRUCTIONS  Take all medicines  (inhaled or pills) as directed by your health care provider.  Avoid over-the-counter medicines or cough syrups that dry up your airway (such as antihistamines) and slow down the elimination of secretions unless instructed otherwise by your health care provider.  If you are a smoker, the most important thing that you can do is stop smoking. Continuing to smoke will cause further lung damage and breathing trouble. Ask your health care provider for help with quitting smoking. He or she can direct you to community resources or hospitals that provide support.  Avoid exposure to irritants such as smoke, chemicals, and fumes that aggravate your breathing.  Use oxygen therapy and pulmonary rehabilitation if directed by your health care provider. If you require home oxygen therapy, ask your health care provider whether you should purchase a pulse oximeter to measure your oxygen level at home.  Avoid contact with individuals who have a contagious illness.  Avoid extreme temperature and humidity changes.  Eat healthy foods. Eating smaller, more frequent meals and resting before meals may help you maintain your strength.  Stay active, but balance activity with periods of rest. Exercise and physical activity will help you maintain your ability to do things you want to do.  Preventing infection and hospitalization is very important when you have COPD. Make sure to receive all the vaccines your health care provider recommends, especially the pneumococcal and influenza vaccines. Ask your health care provider whether you need a pneumonia vaccine.  Learn and use relaxation techniques to manage stress.  Learn and use controlled breathing techniques as directed by your health care provider. Controlled breathing techniques include:  Pursed lip breathing. Start by breathing in (inhaling) through your nose for 1 second. Then, purse your lips as if you were   going to whistle and breathe out (exhale) through the  pursed lips for 2 seconds.  Diaphragmatic breathing. Start by putting one hand on your abdomen just above your waist. Inhale slowly through your nose. The hand on your abdomen should move out. Then purse your lips and exhale slowly. You should be able to feel the hand on your abdomen moving in as you exhale.  Learn and use controlled coughing to clear mucus from your lungs. Controlled coughing is a series of short, progressive coughs. The steps of controlled coughing are: 1. Lean your head slightly forward. 2. Breathe in deeply using diaphragmatic breathing. 3. Try to hold your breath for 3 seconds. 4. Keep your mouth slightly open while coughing twice. 5. Spit any mucus out into a tissue. 6. Rest and repeat the steps once or twice as needed. SEEK MEDICAL CARE IF:  You are coughing up more mucus than usual.  There is a change in the color or thickness of your mucus.  Your breathing is more labored than usual.  Your breathing is faster than usual. SEEK IMMEDIATE MEDICAL CARE IF:  You have shortness of breath while you are resting.  You have shortness of breath that prevents you from:  Being able to talk.  Performing your usual physical activities.  You have chest pain lasting longer than 5 minutes.  Your skin color is more cyanotic than usual.  You measure low oxygen saturations for longer than 5 minutes with a pulse oximeter. MAKE SURE YOU:  Understand these instructions.  Will watch your condition.  Will get help right away if you are not doing well or get worse.   This information is not intended to replace advice given to you by your health care provider. Make sure you discuss any questions you have with your health care provider.   Document Released: 07/24/2005 Document Revised: 11/04/2014 Document Reviewed: 06/10/2013 Elsevier Interactive Patient Education 2016 [employer]  

## 2016-05-13 NOTE — ED Notes (Signed)
Bed: [id number] Expected date:  Expected time:  Means of arrival:  Comments: EMS- 70yo M, SOB/wheezing

## 2016-05-13 NOTE — ED Provider Notes (Signed)
CSN: [id number]     Arrival date & time 05/13/16  1508 History   First MD Initiated Contact with Patient 05/13/16 1524     Chief Complaint  Patient presents with  . Shortness of Breath     ) Patient is a 70 y.o. male presenting with shortness of breath. The history is provided by the patient.  Shortness of Breath Associated symptoms: no abdominal pain, no chest pain, no headaches, no rash and no vomiting   Patient resents with shortness of breath. States he's had it for the last few weeks but worse last few days. History of COPD. States the last doctor "up there" would not fill his medicines. States he has his mask but doesn't have all the other medicines. No fevers. Occasional cough that is unchanged. No swelling in his legs. No chest pain.  Past Medical History  Diagnosis Date  . COPD (chronic obstructive pulmonary disease) (HCC)   . Asthma   . Emphysema   . PTSD (post-traumatic stress disorder)   . Tobacco abuse   . Pneumonia   . Hypertension    Past Surgical History  Procedure Laterality Date  . Hernia repair      hiatal  . Wisdom tooth extraction     Family History  Problem Relation Age of Onset  . Asthma Cousin   . Asthma Cousin   . Diabetes Brother   . Diabetes Mother    Social History  Substance Use Topics  . Smoking status: Current Every Day Smoker -- 2.00 packs/day for 55 years    Types: Cigarettes  . Smokeless tobacco: Never Used  . Alcohol Use: 50.4 oz/week    84 Cans of beer per week    Review of Systems  Constitutional: Negative for activity change and appetite change.  Eyes: Negative for pain.  Respiratory: Positive for shortness of breath. Negative for chest tightness.   Cardiovascular: Negative for chest pain and leg swelling.  Gastrointestinal: Negative for nausea, vomiting, abdominal pain and diarrhea.  Genitourinary: Negative for flank pain.  Musculoskeletal: Negative for back pain and neck stiffness.  Skin: Negative for rash.  Neurological:  Negative for weakness, numbness and headaches.  Psychiatric/Behavioral: Negative for behavioral problems.      Allergies  Review of patient's allergies indicates no known allergies.  Home Medications   Prior to Admission medications   Medication Sig Start Date End Date Taking? Authorizing Provider  albuterol (PROVENTIL) (5 MG/ML) 0.5% nebulizer solution Take 0.5 mLs (2.5 mg total) by nebulization every 4 (four) hours as needed for wheezing or shortness of breath. 12/26/12  Yes Renae Fickle, MD  fluticasone (FLONASE) 50 MCG/ACT nasal spray Place 2 sprays into both nostrils 2 (two) times daily.   Yes Historical Provider, MD  Multiple Vitamin (MULTIVITAMIN WITH MINERALS) TABS tablet Take 1 tablet by mouth daily.   Yes Historical Provider, MD  omeprazole (PRILOSEC) 20 MG capsule Take 20 mg by mouth 2 (two) times daily.   Yes Historical Provider, MD  acetaminophen (TYLENOL) 500 MG tablet Take 500 mg by mouth 3 (three) times daily as needed (for pain.).    Historical Provider, MD  albuterol (PROVENTIL HFA;VENTOLIN HFA) 108 (90 Base) MCG/ACT inhaler Inhale 2 puffs into the lungs every 6 (six) hours as needed. 05/13/16   Benjiman Core, MD  amLODipine (NORVASC) 10 MG tablet Take 1 tablet (10 mg total) by mouth daily. 05/13/16   Benjiman Core, MD  predniSONE (DELTASONE) 20 MG tablet Take 2 tablets (40 mg total) by mouth daily.  05/14/16   Benjiman CoreNathan Keiasia Christianson, MD  sertraline (ZOLOFT) 100 MG tablet Take 1 tablet (100 mg total) by mouth daily. Patient not taking: Reported on 05/13/2016 05/10/15   Earney NavyJosephine C Onuoha, NP  sildenafil (VIAGRA) 100 MG tablet Take 50 mg by mouth daily as needed for erectile dysfunction.    Historical Provider, MD  tiotropium (SPIRIVA) 18 MCG inhalation capsule Place 1 capsule (18 mcg total) into inhaler and inhale daily. 05/13/16   Benjiman CoreNathan Nariah Morgano, MD   BP 122/62 mmHg  Pulse 94  Temp(Src) 98 F (36.7 C) (Oral)  Resp 29  SpO2 100% Physical Exam  Constitutional: He is  oriented to person, place, and time. He appears well-developed and well-nourished.  HENT:  Head: Normocephalic and atraumatic.  Eyes: Pupils are equal, round, and reactive to light.  Neck: Normal range of motion. Neck supple.  Cardiovascular: Normal rate, regular rhythm and normal heart sounds.   No murmur heard. Pulmonary/Chest: Effort normal.  Diffuse wheezes and prolonged expirations. Initially some respiratory distress.  Abdominal: Soft. Bowel sounds are normal. He exhibits no distension and no mass. There is no tenderness. There is no rebound and no guarding.  Musculoskeletal: Normal range of motion. He exhibits no edema.  Neurological: He is alert and oriented to person, place, and time. No cranial nerve deficit.  Skin: Skin is warm and dry.  Nursing note and vitals reviewed.   ED Course  Procedures (including critical care time) Labs Review Labs Reviewed  BASIC METABOLIC PANEL - Abnormal; Notable for the following:    Glucose, Bld 111 (*)    All other components within normal limits  CBC WITH DIFFERENTIAL/PLATELET    Imaging Review Dg Chest 2 View  05/13/2016  CLINICAL DATA:  History of COPD. Current smoker. Shortness of breath and wheezing. EXAM: CHEST  2 VIEW COMPARISON:  Single-view of the chest 03/29/2013. CT chest 12/23/2012. FINDINGS: The lungs are emphysematous. There is some biapical scarring. No consolidative process, pneumothorax or effusion. A 0.7 cm nodular opacity projects in the the left mid lung on the PA view only. Heart size is normal. No focal bony abnormality. Surgical clips at the gastroesophageal junction are noted. IMPRESSION: 0.7 cm nodular opacity projects in the left mid lung. Nonemergent chest CT with contrast is recommended for further evaluation. Emphysema. Atherosclerosis. Electronically Signed   By: Drusilla Kannerhomas  Dalessio M.D.   On: 05/13/2016 17:05   I have personally reviewed and evaluated these images and lab results as part of my medical  decision-making.   EKG Interpretation None      MDM   Final diagnoses:  Chronic obstructive pulmonary disease with acute exacerbation (HCC)    Patient with shortness of breath. Likely COPD exacerbation. His been out of some of his medicines. Feels better after breathing treatment and pulmonary exam improved significantly. Will discharge home with medications. Will follow with primary care doctor.    Benjiman CoreNathan Qamar Rosman, MD 05/13/16 1726

## 2016-05-13 NOTE — ED Notes (Signed)
Pt presents to [facility] with complaints of worsening shortness of breath. Has been using duo-nebs 5+ times a day at home. No chest pain, 12 lead unremarkable per EMS. EMS gave 125mg  solu-medrol. 98% on 8L. Pt does not normally wear oxygen. EMS says he doesn't normally wear oxygen. THey are unable to provide pre-oxygenation pulse ox.

## 2016-08-27 ENCOUNTER — Observation Stay (HOSPITAL_COMMUNITY)
Admission: EM | Admit: 2016-08-27 | Discharge: 2016-08-27 | Disposition: A | Discharge status: Home / Self Care | Payer: Medicare Other | Attending: Internal Medicine | Admitting: Internal Medicine

## 2016-08-27 ENCOUNTER — Emergency Department (HOSPITAL_COMMUNITY): Payer: Medicare Other

## 2016-08-27 ENCOUNTER — Encounter (HOSPITAL_COMMUNITY): Payer: Self-pay | Admitting: Emergency Medicine

## 2016-08-27 DIAGNOSIS — J9622 Acute and chronic respiratory failure with hypercapnia: Secondary | ICD-10-CM | POA: Diagnosis present

## 2016-08-27 DIAGNOSIS — A419 Sepsis, unspecified organism: Secondary | ICD-10-CM | POA: Diagnosis not present

## 2016-08-27 DIAGNOSIS — J189 Pneumonia, unspecified organism: Secondary | ICD-10-CM | POA: Diagnosis present

## 2016-08-27 DIAGNOSIS — Z7189 Other specified counseling: Secondary | ICD-10-CM | POA: Insufficient documentation

## 2016-08-27 DIAGNOSIS — Z66 Do not resuscitate: Secondary | ICD-10-CM | POA: Insufficient documentation

## 2016-08-27 DIAGNOSIS — R509 Fever, unspecified: Secondary | ICD-10-CM

## 2016-08-27 DIAGNOSIS — Z515 Encounter for palliative care: Secondary | ICD-10-CM | POA: Diagnosis not present

## 2016-08-27 DIAGNOSIS — J9690 Respiratory failure, unspecified, unspecified whether with hypoxia or hypercapnia: Secondary | ICD-10-CM | POA: Diagnosis present

## 2016-08-27 DIAGNOSIS — J441 Chronic obstructive pulmonary disease with (acute) exacerbation: Secondary | ICD-10-CM | POA: Diagnosis not present

## 2016-08-27 LAB — COMPREHENSIVE METABOLIC PANEL
ALT: 14 U/L — ABNORMAL LOW (ref 17–63)
AST: 20 U/L (ref 15–41)
Albumin: 3.7 g/dL (ref 3.5–5.0)
Alkaline Phosphatase: 54 U/L (ref 38–126)
Anion gap: 11 (ref 5–15)
BILIRUBIN TOTAL: 1.7 mg/dL — AB (ref 0.3–1.2)
BUN: 13 mg/dL (ref 6–20)
CALCIUM: 8.7 mg/dL — AB (ref 8.9–10.3)
CO2: 32 mmol/L (ref 22–32)
CREATININE: 1.02 mg/dL (ref 0.61–1.24)
Chloride: 94 mmol/L — ABNORMAL LOW (ref 101–111)
GFR calc Af Amer: >60 mL/min (ref >60)
Glucose, Bld: 114 mg/dL — ABNORMAL HIGH (ref 65–99)
Potassium: 4.2 mmol/L (ref 3.5–5.1)
Sodium: 137 mmol/L (ref 135–145)
TOTAL PROTEIN: 6.5 g/dL (ref 6.5–8.1)

## 2016-08-27 LAB — CBC WITH DIFFERENTIAL/PLATELET
BASOS PCT: 0 %
Basophils Absolute: 0 10*3/uL (ref 0.0–0.1)
EOS ABS: 0.1 10*3/uL (ref 0.0–0.7)
EOS PCT: 1 %
HCT: 47.4 % (ref 39.0–52.0)
Hemoglobin: 15.5 g/dL (ref 13.0–17.0)
LYMPHS ABS: 1.2 10*3/uL (ref 0.7–4.0)
Lymphocytes Relative: 9 %
MCH: 28.1 pg (ref 26.0–34.0)
MCHC: 32.7 g/dL (ref 30.0–36.0)
MCV: 86 fL (ref 78.0–100.0)
MONO ABS: 1 10*3/uL (ref 0.1–1.0)
MONOS PCT: 8 %
NEUTROS PCT: 82 %
Neutro Abs: 11.3 10*3/uL — ABNORMAL HIGH (ref 1.7–7.7)
Platelets: 169 10*3/uL (ref 150–400)
RBC: 5.51 MIL/uL (ref 4.22–5.81)
RDW: 17.3 % — AB (ref 11.5–15.5)
WBC: 13.6 10*3/uL — ABNORMAL HIGH (ref 4.0–10.5)

## 2016-08-27 LAB — INFLUENZA PANEL BY PCR (TYPE A & B)
H1N1 flu by pcr: NOT DETECTED
Influenza A By PCR: NEGATIVE
Influenza B By PCR: NEGATIVE

## 2016-08-27 LAB — I-STAT ARTERIAL BLOOD GAS, ED
Acid-Base Excess: 12 mmol/L — ABNORMAL HIGH (ref 0.0–2.0)
Bicarbonate: 43.4 mmol/L — ABNORMAL HIGH (ref 20.0–28.0)
O2 SAT: 100 %
PCO2 ART: 89 mmHg — AB (ref 32.0–48.0)
TCO2: 46 mmol/L (ref 0–100)
pH, Arterial: 7.295 — ABNORMAL LOW (ref 7.350–7.450)
pO2, Arterial: 418 mmHg — ABNORMAL HIGH (ref 83.0–108.0)

## 2016-08-27 LAB — I-STAT CHEM 8, ED
BUN: 14 mg/dL (ref 6–20)
CALCIUM ION: 0.97 mmol/L — AB (ref 1.15–1.40)
Chloride: 94 mmol/L — ABNORMAL LOW (ref 101–111)
Creatinine, Ser: 1 mg/dL (ref 0.61–1.24)
Glucose, Bld: 116 mg/dL — ABNORMAL HIGH (ref 65–99)
HEMATOCRIT: 46 % (ref 39.0–52.0)
HEMOGLOBIN: 15.6 g/dL (ref 13.0–17.0)
Potassium: 3.8 mmol/L (ref 3.5–5.1)
SODIUM: 137 mmol/L (ref 135–145)
TCO2: 38 mmol/L (ref 0–100)

## 2016-08-27 LAB — URINALYSIS, ROUTINE W REFLEX MICROSCOPIC
Glucose, UA: NEGATIVE mg/dL
Ketones, ur: NEGATIVE mg/dL
LEUKOCYTES UA: NEGATIVE
NITRITE: NEGATIVE
PROTEIN: 30 mg/dL — AB
Specific Gravity, Urine: 1.023 (ref 1.005–1.030)
pH: 6 (ref 5.0–8.0)

## 2016-08-27 LAB — URINE MICROSCOPIC-ADD ON

## 2016-08-27 LAB — I-STAT CG4 LACTIC ACID, ED
LACTIC ACID, VENOUS: 1.03 mmol/L (ref 0.5–1.9)
Lactic Acid, Venous: 0.58 mmol/L (ref 0.5–1.9)

## 2016-08-27 LAB — HIV ANTIBODY (ROUTINE TESTING W REFLEX): HIV SCREEN 4TH GENERATION: NONREACTIVE

## 2016-08-27 LAB — STREP PNEUMONIAE URINARY ANTIGEN: Strep Pneumo Urinary Antigen: NEGATIVE

## 2016-08-27 MED ORDER — LEVOFLOXACIN 750 MG PO TABS: 750.0000 mg | ORAL_TABLET | Freq: Every day | ORAL | 0 refills | Status: DC

## 2016-08-27 MED ORDER — VANCOMYCIN HCL IN DEXTROSE 750-5 MG/150ML-% IV SOLN
750.0000 mg | Freq: Two times a day (BID) | INTRAVENOUS | Status: DC
  Filled 2016-08-27 (×2): qty 150

## 2016-08-27 MED ORDER — DEXTROSE 5 % IV SOLN: 1.0000 g | INTRAVENOUS | Status: DC

## 2016-08-27 MED ORDER — SODIUM CHLORIDE 0.9 % IV SOLN
INTRAVENOUS | Status: DC
  Administered 2016-08-27: 07:00:00 via INTRAVENOUS

## 2016-08-27 MED ORDER — DEXTROSE 5 % IV SOLN
1.0000 g | Freq: Once | INTRAVENOUS | Status: AC
  Administered 2016-08-27: 1 g via INTRAVENOUS
  Filled 2016-08-27: qty 10

## 2016-08-27 MED ORDER — METHYLPREDNISOLONE SODIUM SUCC 125 MG IJ SOLR
125.0000 mg | Freq: Once | INTRAMUSCULAR | Status: AC
  Administered 2016-08-27: 125 mg via INTRAVENOUS
  Filled 2016-08-27: qty 2

## 2016-08-27 MED ORDER — METHYLPREDNISOLONE SODIUM SUCC 125 MG IJ SOLR
60.0000 mg | Freq: Two times a day (BID) | INTRAMUSCULAR | Status: DC
  Administered 2016-08-27: 60 mg via INTRAVENOUS
  Filled 2016-08-27: qty 2

## 2016-08-27 MED ORDER — ACETAMINOPHEN 650 MG RE SUPP
650.0000 mg | Freq: Once | RECTAL | Status: AC
  Administered 2016-08-27: 650 mg via RECTAL
  Filled 2016-08-27: qty 1

## 2016-08-27 MED ORDER — DEXTROSE 5 % IV SOLN: 1.0000 g | Freq: Once | INTRAVENOUS | Status: DC

## 2016-08-27 MED ORDER — TIOTROPIUM BROMIDE MONOHYDRATE 18 MCG IN CAPS
18.0000 ug | ORAL_CAPSULE | Freq: Every day | RESPIRATORY_TRACT | Status: DC
  Filled 2016-08-27: qty 5

## 2016-08-27 MED ORDER — DEXTROSE 5 % IV SOLN: 500.0000 mg | INTRAVENOUS | Status: DC

## 2016-08-27 MED ORDER — DEXTROSE 5 % IV SOLN
500.0000 mg | Freq: Once | INTRAVENOUS | Status: AC
  Administered 2016-08-27: 500 mg via INTRAVENOUS
  Filled 2016-08-27: qty 500

## 2016-08-27 MED ORDER — FLUTICASONE PROPIONATE 50 MCG/ACT NA SUSP
2.0000 | Freq: Two times a day (BID) | NASAL | Status: DC
  Administered 2016-08-27: 2 via NASAL
  Filled 2016-08-27: qty 16

## 2016-08-27 MED ORDER — ENOXAPARIN SODIUM 40 MG/0.4ML ~~LOC~~ SOLN
40.0000 mg | Freq: Every day | SUBCUTANEOUS | Status: DC
  Filled 2016-08-27: qty 0.4

## 2016-08-27 MED ORDER — MORPHINE SULFATE (PF) 4 MG/ML IV SOLN
4.0000 mg | Freq: Once | INTRAVENOUS | Status: AC
  Administered 2016-08-27: 4 mg via INTRAVENOUS
  Filled 2016-08-27: qty 1

## 2016-08-27 MED ORDER — MORPHINE SULFATE (PF) 4 MG/ML IV SOLN: 2.0000 mg | Freq: Once | INTRAVENOUS | Status: DC

## 2016-08-27 MED ORDER — MORPHINE SULFATE (PF) 4 MG/ML IV SOLN
2.0000 mg | Freq: Once | INTRAVENOUS | Status: AC
  Administered 2016-08-27: 2 mg via INTRAVENOUS
  Filled 2016-08-27: qty 1

## 2016-08-27 MED ORDER — VANCOMYCIN HCL 10 G IV SOLR
1500.0000 mg | Freq: Once | INTRAVENOUS | Status: AC
  Administered 2016-08-27: 1500 mg via INTRAVENOUS
  Filled 2016-08-27: qty 1500

## 2016-08-27 MED ORDER — ALBUTEROL SULFATE (2.5 MG/3ML) 0.083% IN NEBU: 2.5000 mg | INHALATION_SOLUTION | RESPIRATORY_TRACT | Status: DC | PRN

## 2016-08-27 MED ORDER — ALBUTEROL SULFATE HFA 108 (90 BASE) MCG/ACT IN AERS: 2.0000 | INHALATION_SPRAY | Freq: Four times a day (QID) | RESPIRATORY_TRACT | Status: DC | PRN

## 2016-08-27 NOTE — ED Notes (Signed)
Palliative care NP at bedside. Admitting also called back and asked if palliative NP would call him after evaluation and to discuss pt's level of care.

## 2016-08-27 NOTE — Progress Notes (Signed)
Pharmacy Antibiotic Note  Garnette Scheuermannhomas Galligan is a 70 y.o. male admitted on 08/27/2016 with SOB/PNA.  Pharmacy has been consulted for Vancomycin dosing.  Plan: Vancomycin 1500 mg IV now, then 750 mg IV q12h    Temp (24hrs), Avg:99.1 F (37.3 C), Min:97.6 F (36.4 C), Max:100.6 F (38.1 C)   Recent Labs Lab 08/27/16 0151 08/27/16 0202 08/27/16 0417 08/27/16 0424  WBC 13.6*  --   --   --   CREATININE  --  1.00 1.02  --   LATICACIDVEN  --   --   --  1.03    CrCl cannot be calculated (Unknown ideal weight.).    No Known Allergies   Eddie Candlebbott, Jeyda Siebel Vernon 08/27/2016 5:21 AM

## 2016-08-27 NOTE — ED Notes (Signed)
POA at bedside with MD

## 2016-08-27 NOTE — ED Notes (Signed)
Pt is resting easily at this time, in no distress. Remains on 2L [place] with sats 99%.

## 2016-08-27 NOTE — ED Notes (Signed)
[name] [name] [name] Sheikh, DO at bedside speaking with pt while he is eating. Pt and DO made a plan to discharge pt home now that he is alert and 0x4, pt states he feels much better and wishes to go home to be comfortable. Nephew will be contacted.

## 2016-08-27 NOTE — ED Notes (Signed)
Antibiotics paused, phlebotomy at bedside for cultures

## 2016-08-27 NOTE — ED Notes (Signed)
Palliative NP states is okay to give pt his regular diet. Obverved conversation and palliative np made pt aware if he eats he may aspirate pt still wishes to eat and understands the risks.

## 2016-08-27 NOTE — Care Management Obs Status (Signed)
MEDICARE OBSERVATION STATUS NOTIFICATION   Patient Details  Name: [name] [name] MRN: [id number] Date of Birth: [birth year]   Medicare Observation Status Notification Given:  Yes    Vangie BickerBrown, Simone Tuckey Jane, RN 08/27/2016, 1:47 PM

## 2016-08-27 NOTE — ED Notes (Signed)
Inform palliative care nurse that pt was in emergency department.

## 2016-08-27 NOTE — ED Notes (Signed)
Antibiotics restarted; cultures drawn

## 2016-08-27 NOTE — ED Notes (Signed)
Admitting MD at bedside.

## 2016-08-27 NOTE — Care Management CC44 (Signed)
Condition Code 44 Documentation Completed  Patient Details  Name: [name] Scheuermannhomas Leidy MRN: [id number] Date of Birth: Dec 18, 1945   Condition Code 44 given:  Yes Patient signature on Condition Code 44 notice:  Yes Documentation of 2 MD's agreement:  Yes  Dr. Jacky KindleAronson and Dr. Marland McalpineSheikh.  Code 44 added to claim:  Yes    Vangie BickerBrown, [name] Whitford Jane, RN  [phone] 08/27/2016, 1:48 PM

## 2016-08-27 NOTE — ED Triage Notes (Signed)
Pt arrives via EMS from home, pt is hospice for end-stage COPD. Pt was feeling bad around 6PM yesterday, was given 15MG  morphine and 3MG  lorazepam. EMS noted decreased mental status and RR in the field, initiated IV narcan 2MG  in the field and RR came up to 40. Pt responsive to painful stimuli and being bagged with BVM upon arrival to treatment room. End tidal CO2 15-65 mmHg PTA. DNR and MOST form at bedside.

## 2016-08-27 NOTE — ED Notes (Signed)
Also spoke with admitting MD states will hold pain medication at this time as pt is still lethargic and only responsive to pain. States he will also call palliative care and come see pt..Marland Kitchen

## 2016-08-27 NOTE — H&P (Signed)
History and Physical    [name] [name] ZOX:[id number] DOB: 1946/08/25 DOA: 08/27/2016   PCP: PROVIDER NOT IN SYSTEM Chief Complaint:  Chief Complaint  Patient presents with  . Respiratory Distress    HPI: [name] [name] is a 70 y.o. male with medical history significant of COPD, currently transitioning to hospice.  Patient is unable to provide any history due to AMS.  Per notes in chart, patient was brought in by EMS with assisted ventilations, patient was difficult to arouse at home.  He is DNR/DNI, currently full medical treatment otherwise but transitioning to hospice per hospice nurse on call Olegario Messier.  He took his pain meds including 15mg  morphine and 3mg  lorazepam due to feeling bad yesterday at 6pm.  IV narcan 2mg  given in field and patients RR increased to 40.  ED Course: Patient very lethargic, initially responsive only to very painful stimuli.  ABG shows acute on chronic hypercapnic resp failure.  Due to DNI status he is not intubated and instead placed on BIPAP.  While on BIPAP his mental status improves very slightly to the point where he is not responsive to some stimuli with purposeful movements.  He is placed on stress dose steroids.  Fever of 100.6 is noted as well as other SIRS criteria.  CXR shows questionable pulmonary edema.  Patient is placed on CAP coverage.  Review of Systems: As per HPI otherwise 10 point review of systems negative.    Past Medical History:  Diagnosis Date  . Asthma   . COPD (chronic obstructive pulmonary disease) (HCC)   . Emphysema   . Hypertension   . Pneumonia   . PTSD (post-traumatic stress disorder)   . Tobacco abuse     Past Surgical History:  Procedure Laterality Date  . HERNIA REPAIR     hiatal  . WISDOM TOOTH EXTRACTION       reports that he has been smoking Cigarettes.  He has a 110.00 pack-year smoking history. He has never used smokeless tobacco. He reports that he drinks about 50.4 oz of alcohol per week . He reports that he  does not use drugs.  No Known Allergies  Family History  Problem Relation Age of Onset  . Asthma Cousin   . Asthma Cousin   . Diabetes Brother   . Diabetes Mother       Prior to Admission medications   Medication Sig Start Date End Date Taking? Authorizing Provider  acetaminophen (TYLENOL) 500 MG tablet Take 500 mg by mouth 3 (three) times daily as needed (for pain.).    Historical Provider, MD  albuterol (PROVENTIL HFA;VENTOLIN HFA) 108 (90 Base) MCG/ACT inhaler Inhale 2 puffs into the lungs every 6 (six) hours as needed. 05/13/16   Benjiman Core, MD  albuterol (PROVENTIL) (5 MG/ML) 0.5% nebulizer solution Take 0.5 mLs (2.5 mg total) by nebulization every 4 (four) hours as needed for wheezing or shortness of breath. 12/26/12   Renae Fickle, MD  amLODipine (NORVASC) 10 MG tablet Take 1 tablet (10 mg total) by mouth daily. 05/13/16   Benjiman Core, MD  fluticasone (FLONASE) 50 MCG/ACT nasal spray Place 2 sprays into both nostrils 2 (two) times daily.    Historical Provider, MD  Multiple Vitamin (MULTIVITAMIN WITH MINERALS) TABS tablet Take 1 tablet by mouth daily.    Historical Provider, MD  omeprazole (PRILOSEC) 20 MG capsule Take 20 mg by mouth 2 (two) times daily.    Historical Provider, MD  predniSONE (DELTASONE) 20 MG tablet Take 2 tablets (40 mg total)  by mouth daily. 05/14/16   Benjiman CoreNathan Pickering, MD  sertraline (ZOLOFT) 100 MG tablet Take 1 tablet (100 mg total) by mouth daily. Patient not taking: Reported on 05/13/2016 05/10/15   Earney NavyJosephine C Onuoha, NP  sildenafil (VIAGRA) 100 MG tablet Take 50 mg by mouth daily as needed for erectile dysfunction.    Historical Provider, MD  tiotropium (SPIRIVA) 18 MCG inhalation capsule Place 1 capsule (18 mcg total) into inhaler and inhale daily. 05/13/16   Benjiman CoreNathan Pickering, MD    Physical Exam: Vitals:   08/27/16 0245 08/27/16 0315 08/27/16 0345 08/27/16 0400  BP: 92/70 108/89 104/72 113/81  Pulse: 103 100 101 105  Resp: 20 14 23  (!) 29    Temp:      TempSrc:      SpO2: 97% 97% 95% 94%      Constitutional: Lethargic, arrousable to noxious stimuli,  Eyes: PERRL, lids and conjunctivae normal ENMT: Mucous membranes are moist. Posterior pharynx clear of any exudate or lesions.Normal dentition.  Neck: normal, supple, no masses, no thyromegaly Respiratory: Coarse breath sounds bilaterally, wheezes Cardiovascular: Regular rate and rhythm, no murmurs / rubs / gallops. No extremity edema. 2+ pedal pulses. No carotid bruits.  Abdomen: no tenderness, no masses palpated. No hepatosplenomegaly. Bowel sounds positive.  Musculoskeletal: no clubbing / cyanosis. No joint deformity upper and lower extremities. Good ROM, no contractures. Normal muscle tone.  Skin: no rashes, lesions, ulcers. No induration Neurologic: Lethargic, MAE, spontaneous eye opening, purposeful movements, unintelligible sounds Psychiatric: Normal judgment and insight. Alert and oriented x 3. Normal mood.    Labs on Admission: I have personally reviewed following labs and imaging studies  CBC:  Recent Labs Lab 08/27/16 0151 08/27/16 0202  WBC 13.6*  --   NEUTROABS 11.3*  --   HGB 15.5 15.6  HCT 47.4 46.0  MCV 86.0  --   PLT 169  --    Basic Metabolic Panel:  Recent Labs Lab 08/27/16 0202 08/27/16 0417  NA 137 137  K 3.8 4.2  CL 94* 94*  CO2  --  32  GLUCOSE 116* 114*  BUN 14 13  CREATININE 1.00 1.02  CALCIUM  --  8.7*   GFR: CrCl cannot be calculated (Unknown ideal weight.). Liver Function Tests:  Recent Labs Lab 08/27/16 0417  AST 20  ALT 14*  ALKPHOS 54  BILITOT 1.7*  PROT 6.5  ALBUMIN 3.7   No results for input(s): LIPASE, AMYLASE in the last 168 hours. No results for input(s): AMMONIA in the last 168 hours. Coagulation Profile: No results for input(s): INR, PROTIME in the last 168 hours. Cardiac Enzymes: No results for input(s): CKTOTAL, CKMB, CKMBINDEX, TROPONINI in the last 168 hours. BNP (last 3 results) No results for  input(s): PROBNP in the last 8760 hours. HbA1C: No results for input(s): HGBA1C in the last 72 hours. CBG: No results for input(s): GLUCAP in the last 168 hours. Lipid Profile: No results for input(s): CHOL, HDL, LDLCALC, TRIG, CHOLHDL, LDLDIRECT in the last 72 hours. Thyroid Function Tests: No results for input(s): TSH, T4TOTAL, FREET4, T3FREE, THYROIDAB in the last 72 hours. Anemia Panel: No results for input(s): VITAMINB12, FOLATE, FERRITIN, TIBC, IRON, RETICCTPCT in the last 72 hours. Urine analysis: No results found for: COLORURINE, APPEARANCEUR, LABSPEC, PHURINE, GLUCOSEU, HGBUR, BILIRUBINUR, KETONESUR, PROTEINUR, UROBILINOGEN, NITRITE, LEUKOCYTESUR Sepsis Labs: @LABRCNTIP (procalcitonin:4,lacticidven:4) )No results found for this or any previous visit (from the past 240 hour(s)).   Radiological Exams on Admission: Dg Chest Portable 1 View  Result Date: 08/27/2016 CLINICAL  DATA:  Dyspnea EXAM: PORTABLE CHEST 1 VIEW COMPARISON:  05/13/2016 FINDINGS: Mild interstitial thickening may represent a degree of interstitial edema. No confluent airspace consolidation. No large effusions. The extreme lateral costophrenic angles are excluded from the image. IMPRESSION: Question a degree of interstitial edema. No confluent airspace consolidation or large effusion. Electronically Signed   By: Ellery Plunkaniel R Mitchell M.D.   On: 08/27/2016 01:53    EKG: Independently reviewed.  Assessment/Plan Principal Problem:   Acute on chronic respiratory failure with hypercapnia (HCC) Active Problems:   COPD exacerbation (HCC)   CAP (community acquired pneumonia)   Sepsis (HCC)    1. CAP / COPD exacerbation causing acute on chronic hypercapnic respiratory failure with sepsis - 1. On BIPAP 2. Gentle IVF given CXR suggestion of pulm edema, BPs on the low side however 3. PNA pathway 4. Rocephin / azithromycin 5. Cultures pending 6. Needs palliative care consult to figure out what goals of care are with  Nephew, DNR/DNI status is clear however 7. Holding home BP meds 8. Stress dose steroids   DVT prophylaxis: Lovenox Code Status: DNR/DNI Family Communication: No family in room Consults called: None Admission status: Admit to inpatient   Hillary BowGARDNER, Brinden Kincheloe M. DO [facility] Pager [phone] from 7PM-7AM  If 7AM-7PM, please contact the day physician for the patient [email or web address] Password [email or web address]  08/27/2016, 5:07 AM

## 2016-08-27 NOTE — ED Notes (Signed)
PT is on the phone with nephew at this time and is upset that he is even in the hospital, stating he needs a cigarette at this time. Pt made aware about options to have a nicotine patch pt refuses at this time.

## 2016-08-27 NOTE — Discharge Summary (Signed)
Physician Discharge Summary  [name] Scheuermannhomas Hedglin ZOX:[id number] DOB: 1946/02/17 DOA: 08/27/2016  PCP: PROVIDER NOT IN SYSTEM  Admit date: 08/27/2016 Discharge date: 08/27/2016  Admitted From: Home Disposition:  Home with Hospice  Recommendations for Outpatient Follow-up:  1. Follow up with PCP in 1-2 weeks 2. Please obtain BMP/CBC in one week 3. Please follow up on the following pending results:  Home Health: No Equipment/Devices: None  Discharge Condition: Hospice CODE STATUS: DNR/DNI Diet recommendation: Regular Diet for Pleasure Feedings.   Brief/Interim Summary: [name] Scheuermannhomas Katzenberger is a 70 y.o. male with medical history significant of COPD, currently transitioned to hospice. Patient was unable to provide any history due to AMS to the admitting physician.  Per notes in chart, patient was brought in by EMS with assisted ventilations, patient was difficult to arouse at home.  He is DNR/DNI, currently full medical treatment otherwise but transitioning to hospice per hospice nurse on call Olegario MessierKathy.  He took his pain meds including 15mg  morphine and 3mg  lorazepam due to feeling bad yesterday at 6pm.  IV narcan 2mg  given in field and patients RR increased to 40. He had to be placed on BiPAP where he significantly improved and was titrated to [place]. Patient was alert and responsive and when discussed the plan of Care he wanted Pleasure Feedings for Comfort and wanted to go home. He stated he just wants to be at home and does not want work up in the Hospital and is willing to go on Oral Antibiotics. Patient Discharged Home with Hospice and Palliative Care saw patient today. Hospice to Follow as outpatient.   Discharge Diagnoses:  Principal Problem:   Acute on chronic respiratory failure with hypercapnia (HCC) Active Problems:   COPD exacerbation (HCC)   CAP (community acquired pneumonia)   Sepsis (HCC)   1. Acute Hypercarbic Hypoxic on Chronic Respiratory Failure requiring NIPPV 2/2 to  COPD/CAP -Improved. Patient Alert and Oriented x3. Wishes EMS was never called and wants to go Home.  2. CAP/COPD/Aspiration -Patient wants to avoid Hospitalization and Try po Abx -States he has everything he needs at Home -Started Levofloxacin for CAP/COPD/Aspiration  -Patient wishes to continue to Eat even at risk of Aspiration as patient wants comfort -[facility]; Clearly stated he does not want aggressive measures and wants to go home to be comfortable. Decided on No Ventilator, Limited Antibiotics, and Short term IVF but wants to leave and States his Nephew should have never called EMS.   Discharge Instructions    Medication List    TAKE these medications   acetaminophen 500 MG tablet Commonly known as:  TYLENOL Take 500 mg by mouth 3 (three) times daily as needed (for pain.).   albuterol (5 MG/ML) 0.5% nebulizer solution Commonly known as:  PROVENTIL Take 0.5 mLs (2.5 mg total) by nebulization every 4 (four) hours as needed for wheezing or shortness of breath.   albuterol 108 (90 Base) MCG/ACT inhaler Commonly known as:  PROVENTIL HFA;VENTOLIN HFA Inhale 2 puffs into the lungs every 6 (six) hours as needed.   amLODipine 10 MG tablet Commonly known as:  NORVASC Take 1 tablet (10 mg total) by mouth daily.   fluticasone 50 MCG/ACT nasal spray Commonly known as:  FLONASE Place 2 sprays into both nostrils 2 (two) times daily.   levofloxacin 750 MG tablet Commonly known as:  LEVAQUIN Take 1 tablet (750 mg total) by mouth daily.   multivitamin with minerals Tabs tablet Take 1 tablet by mouth daily.  omeprazole 20 MG capsule Commonly known as:  PRILOSEC Take 20 mg by mouth 2 (two) times daily.   predniSONE 20 MG tablet Commonly known as:  DELTASONE Take 2 tablets (40 mg total) by mouth daily.   sertraline 100 MG tablet Commonly known as:  ZOLOFT Take 1 tablet (100 mg total) by mouth daily.   sildenafil 100 MG tablet Commonly known  as:  VIAGRA Take 50 mg by mouth daily as needed for erectile dysfunction.   tiotropium 18 MCG inhalation capsule Commonly known as:  SPIRIVA Place 1 capsule (18 mcg total) into inhaler and inhale daily.       No Known Allergies  Consultations:  Palliative Care Medicine  Procedures/Studies: Dg Chest Portable 1 View  Result Date: 08/27/2016 CLINICAL DATA:  Dyspnea EXAM: PORTABLE CHEST 1 VIEW COMPARISON:  05/13/2016 FINDINGS: Mild interstitial thickening may represent a degree of interstitial edema. No confluent airspace consolidation. No large effusions. The extreme lateral costophrenic angles are excluded from the image. IMPRESSION: Question a degree of interstitial edema. No confluent airspace consolidation or large effusion. Electronically Signed   By: Ellery Plunkaniel R Mitchell M.D.   On: 08/27/2016 01:53     Subjective: Initially seen and examined at bedside and he was somnolent and difficult to arouse but then woke up and was alert and oriented x4. Discussed plan of Care with him and he did not want aggressive measures and did not want to be hospitalized. Stated he would try Antibiotics by Mouth. Was getting agitated because he wanted to leave the Hospital sooner. States he is in Hospice and has everything at home.   Discharge Exam: Vitals:   08/27/16 1202 08/27/16 1215  BP:  124/77  Pulse: 100 99  Resp: 21 26  Temp:     Vitals:   08/27/16 1130 08/27/16 1159 08/27/16 1202 08/27/16 1215  BP: 100/78   124/77  Pulse: 92 101 100 99  Resp: 16 (!) 32 21 26  Temp:      TempSrc:      SpO2: 96% (!) 87% 96% 98%   General: Pt is alert, awake, in mild respiratory distress Cardiovascular: Tachycardic Rhythm, S1/S2 +, no rubs, no gallops Respiratory: Diminished with some Rhonchi bilaterally, no wheezing, no appreciable crackles Abdominal: Soft, NT, ND, bowel sounds + Extremities: no edema, no cyanosis  The results of significant diagnostics from this hospitalization (including imaging,  microbiology, ancillary and laboratory) are listed below for reference.    Microbiology: No results found for this or any previous visit (from the past 240 hour(s)).   Labs: BNP (last 3 results) No results for input(s): BNP in the last 8760 hours. Basic Metabolic Panel:  Recent Labs Lab 08/27/16 0202 08/27/16 0417  NA 137 137  K 3.8 4.2  CL 94* 94*  CO2  --  32  GLUCOSE 116* 114*  BUN 14 13  CREATININE 1.00 1.02  CALCIUM  --  8.7*   Liver Function Tests:  Recent Labs Lab 08/27/16 0417  AST 20  ALT 14*  ALKPHOS 54  BILITOT 1.7*  PROT 6.5  ALBUMIN 3.7   No results for input(s): LIPASE, AMYLASE in the last 168 hours. No results for input(s): AMMONIA in the last 168 hours. CBC:  Recent Labs Lab 08/27/16 0151 08/27/16 0202  WBC 13.6*  --   NEUTROABS 11.3*  --   HGB 15.5 15.6  HCT 47.4 46.0  MCV 86.0  --   PLT 169  --    Cardiac Enzymes: No results for input(s):  CKTOTAL, CKMB, CKMBINDEX, TROPONINI in the last 168 hours. BNP: Invalid input(s): POCBNP CBG: No results for input(s): GLUCAP in the last 168 hours. D-Dimer No results for input(s): DDIMER in the last 72 hours. Hgb A1c No results for input(s): HGBA1C in the last 72 hours. Lipid Profile No results for input(s): CHOL, HDL, LDLCALC, TRIG, CHOLHDL, LDLDIRECT in the last 72 hours. Thyroid function studies No results for input(s): TSH, T4TOTAL, T3FREE, THYROIDAB in the last 72 hours.  Invalid input(s): FREET3 Anemia work up No results for input(s): VITAMINB12, FOLATE, FERRITIN, TIBC, IRON, RETICCTPCT in the last 72 hours. Urinalysis    Component Value Date/Time   COLORURINE AMBER (A) 08/27/2016 0510   APPEARANCEUR CLEAR 08/27/2016 0510   LABSPEC 1.023 08/27/2016 0510   PHURINE 6.0 08/27/2016 0510   GLUCOSEU NEGATIVE 08/27/2016 0510   HGBUR TRACE (A) 08/27/2016 0510   BILIRUBINUR SMALL (A) 08/27/2016 0510   KETONESUR NEGATIVE 08/27/2016 0510   PROTEINUR 30 (A) 08/27/2016 0510   NITRITE  NEGATIVE 08/27/2016 0510   LEUKOCYTESUR NEGATIVE 08/27/2016 0510   Sepsis Labs Invalid input(s): PROCALCITONIN,  WBC,  LACTICIDVEN Microbiology No results found for this or any previous visit (from the past 240 hour(s)).  Time coordinating discharge: Over 30 minutes  SIGNED:  Merlene Laughter, DO [facility] 08/27/2016, 1:03 PM Pager [phone]  If 7PM-7AM, please contact night-coverage [email or web address] Password [email or web address]

## 2016-08-27 NOTE — ED Notes (Signed)
Trailing off of bipap at this time, on 2L. Pt pulling at tubes

## 2016-08-27 NOTE — Consult Note (Signed)
Consultation Note Date: 08/27/2016   Patient Name: [name] [name]  DOB: August 03, 1946  MRN: [id number]  Age / Sex: 70 y.o., male  PCP: Provider Not In System Referring Physician: Merlene Laughter, DO  Reason for Consultation: Establishing goals of care  HPI/Patient Profile: 70 y.o. male  with past medical history of COPD currently a patient of [facility] admitted on 08/27/2016 with hypoxia and AMS. Patient was found in his home by his nephew, difficult to arouse. EMS was called and patient was given narcan in the field. ED workup reveals acute on chronic hypercapnic respiratory failure. He was placed on BiPap and has since recovered mental status. Palliative care consulted for GOC.   Clinical Assessment and Goals of Care:  Patient has DNR and MOST forms at bedside. Patient confirms DNR status states, "I'm ready to die". Patient denies suicidal ideation, states he only took prescribed amount of medication at home. He notes he has end stage lung disease and does not want heroic interventions and wants to avoid hospitalization. He believes his nephew called EMS and he wishes that he hadn't. Patient does want antibiotics if he has pneumonia or other infections. Noted patient aspirated while drinking juice. Patient stated he wants comfort feeding and does not want artificial feeding or hydration. Discussed the risks of aspiration with patient and he clearly stated he wants to proceed with comfort feeding.    Primary Decision Maker PATIENT    SUMMARY OF RECOMMENDATIONS -DNR -Treat what is treatable/reversible, do not escalate care -Patient's goals of care are comfort -Care management consult to confirm followup with [facility] at [place] -Comfort feeding    Code Status/Advance Care Planning:  DNR    Symptom Management:   As above  Additional Recommendations (Limitations, Scope,  Preferences):  Avoid Hospitalization, Full Comfort Care, Minimize Medications, Initiate Comfort Feeding, No Artificial Feeding, No Surgical Procedures and No Tracheostomy  Psycho-social/Spiritual:   Desire for further Chaplaincy support:No  Additional Recommendations: Education on Hospice  Prognosis:    < 6 months d/t end stage COPD, Hospice patient and GOC = comfort  Discharge Planning: Home with Hospice  Primary Diagnoses: Present on Admission: . Acute on chronic respiratory failure with hypercapnia (HCC) . (Resolved) Respiratory failure (HCC) . CAP (community acquired pneumonia) . COPD exacerbation (HCC) . Sepsis (HCC)   I have reviewed the medical record, interviewed the patient and family, and examined the patient. The following aspects are pertinent.  Past Medical History:  Diagnosis Date  . Asthma   . COPD (chronic obstructive pulmonary disease) (HCC)   . Emphysema   . Hypertension   . Pneumonia   . PTSD (post-traumatic stress disorder)   . Tobacco abuse    Social History   Social History  . Marital status: Single    Spouse name: N/A  . Number of children: N/A  . Years of education: N/A   Social History Main Topics  . Smoking status: Current Every Day Smoker    Packs/day: 2.00    Years: 55.00  Types: Cigarettes  . Smokeless tobacco: Never Used  . Alcohol use 50.4 oz/week    84 Cans of beer per week  . Drug use: No  . Sexual activity: Not Asked   Other Topics Concern  . None   Social History Narrative   [place]  Owns horses.  [facility]     Family History  Problem Relation Age of Onset  . Asthma Cousin   . Asthma Cousin   . Diabetes Brother   . Diabetes Mother    Scheduled Meds: . enoxaparin (LOVENOX) injection  40 mg Subcutaneous Daily  . fluticasone  2 spray Each Nare BID  . methylPREDNISolone (SOLU-MEDROL) injection  60 mg Intravenous Q12H  . tiotropium  18 mcg Inhalation  Daily  . vancomycin  750 mg Intravenous Q12H   Continuous Infusions: . sodium chloride 75 mL/hr at 08/27/16 0643  . azithromycin    . cefTRIAXone (ROCEPHIN)  IV     PRN Meds:.albuterol Medications Prior to Admission:  Prior to Admission medications   Medication Sig Start Date End Date Taking? Authorizing Provider  acetaminophen (TYLENOL) 500 MG tablet Take 500 mg by mouth 3 (three) times daily as needed (for pain.).    Historical Provider, MD  albuterol (PROVENTIL HFA;VENTOLIN HFA) 108 (90 Base) MCG/ACT inhaler Inhale 2 puffs into the lungs every 6 (six) hours as needed. 05/13/16   Benjiman CoreNathan Pickering, MD  albuterol (PROVENTIL) (5 MG/ML) 0.5% nebulizer solution Take 0.5 mLs (2.5 mg total) by nebulization every 4 (four) hours as needed for wheezing or shortness of breath. 12/26/12   Renae FickleMackenzie Short, MD  amLODipine (NORVASC) 10 MG tablet Take 1 tablet (10 mg total) by mouth daily. 05/13/16   Benjiman CoreNathan Pickering, MD  fluticasone (FLONASE) 50 MCG/ACT nasal spray Place 2 sprays into both nostrils 2 (two) times daily.    Historical Provider, MD  Multiple Vitamin (MULTIVITAMIN WITH MINERALS) TABS tablet Take 1 tablet by mouth daily.    Historical Provider, MD  omeprazole (PRILOSEC) 20 MG capsule Take 20 mg by mouth 2 (two) times daily.    Historical Provider, MD  predniSONE (DELTASONE) 20 MG tablet Take 2 tablets (40 mg total) by mouth daily. 05/14/16   Benjiman CoreNathan Pickering, MD  sertraline (ZOLOFT) 100 MG tablet Take 1 tablet (100 mg total) by mouth daily. Patient not taking: Reported on 05/13/2016 05/10/15   Earney NavyJosephine C Onuoha, NP  sildenafil (VIAGRA) 100 MG tablet Take 50 mg by mouth daily as needed for erectile dysfunction.    Historical Provider, MD  tiotropium (SPIRIVA) 18 MCG inhalation capsule Place 1 capsule (18 mcg total) into inhaler and inhale daily. 05/13/16   Benjiman CoreNathan Pickering, MD   No Known Allergies Review of Systems  Constitutional: Positive for activity change and fatigue.  HENT: Negative.     Eyes: Negative.   Respiratory: Positive for cough, choking, shortness of breath and wheezing.   Cardiovascular: Negative.   Gastrointestinal: Negative.   Endocrine: Negative.   Skin: Negative.   Neurological: Positive for tremors.  Psychiatric/Behavioral: Negative for suicidal ideas.    Physical Exam  Constitutional: He is oriented to person, place, and time. He appears well-developed and well-nourished. No distress.  Cardiovascular: Normal rate and regular rhythm.   Pulmonary/Chest: He has wheezes. He has rales.  clubbing  Abdominal: Soft. Bowel sounds are normal.  Neurological: He is alert and oriented to person, place, and time.  Skin: Skin is warm and dry.  Vital Signs: BP 124/77   Pulse 99   Temp 97.5 F (36.4 C) (Axillary)   Resp 26   SpO2 98%  Pain Assessment: 0-10   Pain Score: Asleep   SpO2: SpO2: 98 % O2 Device:SpO2: 98 % O2 Flow Rate: .O2 Flow Rate (L/min): 2 L/min  IO: Intake/output summary:  Intake/Output Summary (Last 24 hours) at 08/27/16 1301 Last data filed at 08/27/16 0901  Gross per 24 hour  Intake              800 ml  Output                0 ml  Net              800 ml    LBM:   Baseline Weight:   Most recent weight:       Palliative Assessment/Data: PPS: 50%     Thank you for this consult. Palliative medicine will continue to follow and assist as needed.   Time In:1130 Time Out: 1230 Time Total: 60 minutes  Greater than 50%  of this time was spent counseling and coordinating care related to the above assessment and plan.  Signed by: Ocie BobKasie Mahan, AGNP-C Palliative Medicine    Please contact Palliative Medicine Team phone at [phone] for questions and concerns.  For individual provider: See Loretha StaplerAmion

## 2016-08-27 NOTE — ED Notes (Signed)
Pt is resting easily but able to wake up with loud verbal stimulus. Appears in no distress, breathing easily.

## 2016-08-27 NOTE — ED Provider Notes (Signed)
[facility]-EMERGENCY DEPT Provider Note   CSN: [id number] Arrival date & time: 08/27/16  0130  By signing my name below, I, Clovis PuAvnee Patel, attest that this documentation has been prepared under the direction and in the presence of Shon Batonourtney F Khamryn Calderone, MD  Electronically Signed: Clovis PuAvnee Patel, ED Scribe. 08/27/16. 1:45 AM.  LEVEL 5 CAVEAT DUE TO RESPIRATORY DISTRESS  History   Chief Complaint Chief Complaint  Patient presents with  . Respiratory Distress   HPI LEVEL 5 CAVEAT DUE TO RESPIRATORY DISTRESS.  Patient presents by EMS with assisted ventilations. Per EMS difficult to arouse at home. He is on hospice with a DO NOT RESUSCITATE. Question of whether he took additional pain medication this evening to make him somnolent. He received 2 mg of Narcan with some improvement of his respirations. No additional collateral information available  Past Medical History:  Diagnosis Date  . Asthma   . COPD (chronic obstructive pulmonary disease) (HCC)   . Emphysema   . Hypertension   . Pneumonia   . PTSD (post-traumatic stress disorder)   . Tobacco abuse     Patient Active Problem List   Diagnosis Date Noted  . Homicidal ideation   . Involuntary commitment   . Mood disorder (HCC) 05/10/2015  . Elevated blood pressure 12/25/2012  . MRSA cellulitis 12/22/2012  . Hyponatremia 12/22/2012  . Axillary abscess 12/22/2012  . COPD exacerbation (HCC) 12/22/2012  . Acute respiratory failure with hypoxia (HCC) 12/01/2012  . Thrombocytopenia (HCC) 12/01/2012  . Sinus tachycardia 12/01/2012  . Borderline diabetes 12/01/2012  . COPD (chronic obstructive pulmonary disease) (HCC)   . PTSD (post-traumatic stress disorder)   . Tobacco abuse     Past Surgical History:  Procedure Laterality Date  . HERNIA REPAIR     hiatal  . WISDOM TOOTH EXTRACTION         Home Medications    Prior to Admission medications   Medication Sig Start Date End Date Taking? Authorizing Provider  acetaminophen  (TYLENOL) 500 MG tablet Take 500 mg by mouth 3 (three) times daily as needed (for pain.).    Historical Provider, MD  albuterol (PROVENTIL HFA;VENTOLIN HFA) 108 (90 Base) MCG/ACT inhaler Inhale 2 puffs into the lungs every 6 (six) hours as needed. 05/13/16   Benjiman CoreNathan Pickering, MD  albuterol (PROVENTIL) (5 MG/ML) 0.5% nebulizer solution Take 0.5 mLs (2.5 mg total) by nebulization every 4 (four) hours as needed for wheezing or shortness of breath. 12/26/12   Renae FickleMackenzie Short, MD  amLODipine (NORVASC) 10 MG tablet Take 1 tablet (10 mg total) by mouth daily. 05/13/16   Benjiman CoreNathan Pickering, MD  fluticasone (FLONASE) 50 MCG/ACT nasal spray Place 2 sprays into both nostrils 2 (two) times daily.    Historical Provider, MD  Multiple Vitamin (MULTIVITAMIN WITH MINERALS) TABS tablet Take 1 tablet by mouth daily.    Historical Provider, MD  omeprazole (PRILOSEC) 20 MG capsule Take 20 mg by mouth 2 (two) times daily.    Historical Provider, MD  predniSONE (DELTASONE) 20 MG tablet Take 2 tablets (40 mg total) by mouth daily. 05/14/16   Benjiman CoreNathan Pickering, MD  sertraline (ZOLOFT) 100 MG tablet Take 1 tablet (100 mg total) by mouth daily. Patient not taking: Reported on 05/13/2016 05/10/15   Earney NavyJosephine C Onuoha, NP  sildenafil (VIAGRA) 100 MG tablet Take 50 mg by mouth daily as needed for erectile dysfunction.    Historical Provider, MD  tiotropium (SPIRIVA) 18 MCG inhalation capsule Place 1 capsule (18 mcg total) into inhaler and inhale daily.  05/13/16   Benjiman CoreNathan Pickering, MD    Family History Family History  Problem Relation Age of Onset  . Asthma Cousin   . Asthma Cousin   . Diabetes Brother   . Diabetes Mother     Social History Social History  Substance Use Topics  . Smoking status: Current Every Day Smoker    Packs/day: 2.00    Years: 55.00    Types: Cigarettes  . Smokeless tobacco: Never Used  . Alcohol use 50.4 oz/week    84 Cans of beer per week     Allergies   Review of patient's allergies indicates no  known allergies.   Review of Systems Review of Systems  Unable to perform ROS: Severe respiratory distress   LEVEL 5 CAVEAT DUE TO RESPIRATORY DISTRESS   Physical Exam Updated Vital Signs BP 101/67   Pulse 112   Temp 100.6 F (38.1 C) (Rectal)   Resp (!) 30   SpO2 99%   Physical Exam  Constitutional:  Chronically ill-appearing, sonorous respirations, moaning   HENT:  Head: Normocephalic and atraumatic.  Cardiovascular: Regular rhythm and normal heart sounds.   No murmur heard. Tachycardia  Pulmonary/Chest: He is in respiratory distress. He has no wheezes.  Poor air movement, scant wheezing, sonorous respirations  Abdominal: Soft. Bowel sounds are normal. He exhibits distension. There is no tenderness. There is no rebound.  Musculoskeletal: He exhibits no edema.  Neurological: He is alert. GCS eye subscore is 1. GCS verbal subscore is 2. GCS motor subscore is 5.  Appears to move all 4 extremities  Skin: Skin is warm and dry.  Nursing note and vitals reviewed.     ED Treatments / Results  Labs (all labs ordered are listed, but only abnormal results are displayed) Labs Reviewed  CBC WITH DIFFERENTIAL/PLATELET - Abnormal; Notable for the following:       Result Value   WBC 13.6 (*)    RDW 17.3 (*)    Neutro Abs 11.3 (*)    All other components within normal limits  I-STAT CHEM 8, ED - Abnormal; Notable for the following:    Chloride 94 (*)    Glucose, Bld 116 (*)    Calcium, Ion 0.97 (*)    All other components within normal limits  I-STAT ARTERIAL BLOOD GAS, ED - Abnormal; Notable for the following:    pH, Arterial 7.295 (*)    pCO2 arterial 89.0 (*)    pO2, Arterial 418.0 (*)    Bicarbonate 43.4 (*)    Acid-Base Excess 12.0 (*)    All other components within normal limits  CULTURE, BLOOD (ROUTINE X 2)  CULTURE, BLOOD (ROUTINE X 2)  URINE CULTURE  BLOOD GAS, ARTERIAL  COMPREHENSIVE METABOLIC PANEL  URINALYSIS, ROUTINE W REFLEX MICROSCOPIC (NOT [facility])    I-STAT CG4 LACTIC ACID, ED  I-STAT CG4 LACTIC ACID, ED    EKG  EKG Interpretation  Date/Time:  Tuesday August 27 2016 01:38:45 EDT Ventricular Rate:  134 PR Interval:    QRS Duration: 87 QT Interval:  304 QTC Calculation: 419 R Axis:   97 Text Interpretation:  Sinus tachycardia Artifact Reconfirmed by Wilkie AyeHORTON  MD, Esaul Dorwart ([id number]) on 08/27/2016 1:59:49 AM       Radiology Dg Chest Portable 1 View  Result Date: 08/27/2016 CLINICAL DATA:  Dyspnea EXAM: PORTABLE CHEST 1 VIEW COMPARISON:  05/13/2016 FINDINGS: Mild interstitial thickening may represent a degree of interstitial edema. No confluent airspace consolidation. No large effusions. The extreme lateral costophrenic angles are  excluded from the image. IMPRESSION: Question a degree of interstitial edema. No confluent airspace consolidation or large effusion. Electronically Signed   By: Ellery Plunk M.D.   On: 08/27/2016 01:53    Procedures Procedures (including critical care time)  CRITICAL CARE Performed by: Shon Baton   Total critical care time: 45 minutes  Critical care time was exclusive of separately billable procedures and treating other patients.  Critical care was necessary to treat or prevent imminent or life-threatening deterioration.  Critical care was time spent personally by me on the following activities: development of treatment plan with patient and/or surrogate as well as nursing, discussions with consultants, evaluation of patient's response to treatment, examination of patient, obtaining history from patient or surrogate, ordering and performing treatments and interventions, ordering and review of laboratory studies, ordering and review of radiographic studies, pulse oximetry and re-evaluation of patient's condition.   Medications Ordered in ED Medications  cefTRIAXone (ROCEPHIN) 1 g in dextrose 5 % 50 mL IVPB (not administered)  azithromycin (ZITHROMAX) 500 mg in dextrose 5 % 250 mL IVPB  (not administered)  morphine 4 MG/ML injection 4 mg (4 mg Intravenous Given 08/27/16 0150)  methylPREDNISolone sodium succinate (SOLU-MEDROL) 125 mg/2 mL injection 125 mg (125 mg Intravenous Given 08/27/16 0238)     Initial Impression / Assessment and Plan / ED Course  I have reviewed the triage vital signs and the nursing notes.  Pertinent labs & imaging results that were available during my care of the patient were reviewed by me and considered in my medical decision making (see chart for details).  Clinical Course    Patient presents in respiratory distress. He has a DO NOT RESUSCITATE and a most form which indicates no intubation or CPR. Limited medical interventions but amenable to antibiotics and fluids as well as BiPAP. Patient is spontaneously breathing on his own. Sonorous.  ABG shows acute hypercapnic respiratory failure. Patient was placed on BiPAP until family arrived. Next of kin, Reita Cliche, patient's nephew has arrived. He brought his hospice books. He is unable to further clarify goals of care at this time. He wants "everything done on the most form." I discussed the patient with the hospice nurse on-call, Olegario Messier. Patient is currently being evaluated for hospice but has only had his palliative care consultation. She will inform the NP in the morning of his presence. Decision made to treat any possible reversible causes and further discussions regarding goals of care in the morning. Patient was noted to be febrile. Sepsis workup initiated. Leukocytosis. Chest x-ray shows some congestion. Patient treated for pneumonia and given Solu-Medrol given history of COPD. Fluids were withheld given patient's respiratory status and goals of care.    Final Clinical Impressions(s) / ED Diagnoses   Final diagnoses:  Acute on chronic respiratory failure with hypercapnia (HCC)  Fever, unspecified fever cause    New Prescriptions New Prescriptions   No medications on file   I personally performed  the services described in this documentation, which was scribed in my presence. The recorded information has been reviewed and is accurate.    Shon Baton, MD 08/27/16 [phone]

## 2016-08-27 NOTE — ED Notes (Signed)
Updated patient's nephew.

## 2016-08-27 NOTE — Discharge Planning (Signed)
EDCM contacted Kathie RhodesBetty, Palliative Care RN from [facility] to inform her of pt discharge home today.  Keshauna Degraffenreid J. Lucretia RoersWood, RN, BSN, [place] [phone]

## 2016-08-27 NOTE — ED Notes (Signed)
Pt awake now requesting food. Pt has heart healthy diet ordered. Meal tray order placed to service response.

## 2016-08-27 NOTE — ED Notes (Signed)
Pt sat up at a 90 degree angle and was given orange juice and after two sips began coughing and having a "gurling" cough. Pt sat's dropped into lows 90's and pt has rhroni upon lung sound assessment. Pt able to speak but in choppy sentences. Admitting paged to make aware. pts on 4L [place] at this time sats 97%. Pts airways intact. Pt made NPO at this time.

## 2016-08-27 NOTE — ED Notes (Signed)
Nephew is on way. Pt starting to take all of his cardiac monitoring off because he is ready to leave. Pt and nephew have been informed on discharge instructions.

## 2016-08-28 LAB — URINE CULTURE: Culture: NO GROWTH

## 2016-09-01 LAB — CULTURE, BLOOD (ROUTINE X 2)
Culture: NO GROWTH
Culture: NO GROWTH

## 2016-09-05 ENCOUNTER — Emergency Department (HOSPITAL_COMMUNITY)

## 2016-09-05 ENCOUNTER — Emergency Department (HOSPITAL_COMMUNITY)
Admission: EM | Admit: 2016-09-05 | Discharge: 2016-09-05 | Discharge status: Against Medical Advice | Attending: Emergency Medicine | Admitting: Emergency Medicine

## 2016-09-05 ENCOUNTER — Encounter (HOSPITAL_COMMUNITY): Payer: Self-pay

## 2016-09-05 DIAGNOSIS — Z79899 Other long term (current) drug therapy: Secondary | ICD-10-CM | POA: Diagnosis not present

## 2016-09-05 DIAGNOSIS — J45909 Unspecified asthma, uncomplicated: Secondary | ICD-10-CM | POA: Diagnosis not present

## 2016-09-05 DIAGNOSIS — I998 Other disorder of circulatory system: Secondary | ICD-10-CM | POA: Diagnosis not present

## 2016-09-05 DIAGNOSIS — F17210 Nicotine dependence, cigarettes, uncomplicated: Secondary | ICD-10-CM | POA: Insufficient documentation

## 2016-09-05 DIAGNOSIS — I10 Essential (primary) hypertension: Secondary | ICD-10-CM | POA: Insufficient documentation

## 2016-09-05 DIAGNOSIS — J449 Chronic obstructive pulmonary disease, unspecified: Secondary | ICD-10-CM | POA: Insufficient documentation

## 2016-09-05 DIAGNOSIS — M79675 Pain in left toe(s): Secondary | ICD-10-CM | POA: Diagnosis present

## 2016-09-05 MED ORDER — CLINDAMYCIN HCL 300 MG PO CAPS: 300.0000 mg | ORAL_CAPSULE | Freq: Four times a day (QID) | ORAL | 0 refills | Status: DC

## 2016-09-05 MED ORDER — HYDROCODONE-ACETAMINOPHEN 5-325 MG PO TABS
2.0000 | ORAL_TABLET | Freq: Once | ORAL | Status: AC
  Administered 2016-09-05: 2 via ORAL
  Filled 2016-09-05: qty 2

## 2016-09-05 MED ORDER — HYDROCODONE-ACETAMINOPHEN 5-325 MG PO TABS: 1.0000 | ORAL_TABLET | Freq: Four times a day (QID) | ORAL | 0 refills | Status: DC | PRN

## 2016-09-05 NOTE — ED Notes (Signed)
Pt is "demanding" on assessment.  Will notify MD to see patient in triage and assess need for immediate treatment.  Pt vitals and assessment stable in triage at this time.

## 2016-09-05 NOTE — ED Notes (Signed)
Pt asking to leave.  [facility] in [facility].  Notified MD.

## 2016-09-05 NOTE — ED Triage Notes (Signed)
Pt with hospice care at home.  Pt states he was bitten by a black spider yesterday.  Today his left big toe is black.

## 2016-09-05 NOTE — Discharge Instructions (Signed)
We suspect that you have a DYING/DEAD toe. We wanted you to stay in the hospital and get treatment, but you have opted to leave against medical advise. Start the antibiotics. [facility] AS YOU HAVE PROMISED US. The toe can get infected and cause infection to spread all over your body - so it is prudent to get optimal care.

## 2016-09-05 NOTE — ED Provider Notes (Signed)
[facility]-EMERGENCY DEPT Provider Note   CSN: [id number] Arrival date & time: 09/05/16  1653     History   Chief Complaint Chief Complaint  Patient presents with  . Toe Pain    HPI [name] [name] is a 70 y.o. male.  HPI  Pt comes in with cc of advanced COPD on Hospice. He reports having toe pain since 2 nights ago, followed by noticing black toe yday morning. PT has had constant pain since then. Pain is throbbing and severe. No n/v/f/c. PT is a 2 ppd smoker. No cardiac dz.   Past Medical History:  Diagnosis Date  . Asthma   . COPD (chronic obstructive pulmonary disease) (HCC)   . Emphysema   . Hypertension   . Pneumonia   . PTSD (post-traumatic stress disorder)   . Tobacco abuse     Patient Active Problem List   Diagnosis Date Noted  . Acute on chronic respiratory failure with hypercapnia (HCC) 08/27/2016  . CAP (community acquired pneumonia) 08/27/2016  . Sepsis (HCC) 08/27/2016  . Respiratory failure (HCC) 08/27/2016  . Goals of care, counseling/discussion   . Palliative care by specialist   . Homicidal ideation   . Involuntary commitment   . Mood disorder (HCC) 05/10/2015  . Elevated blood pressure 12/25/2012  . MRSA cellulitis 12/22/2012  . Hyponatremia 12/22/2012  . Axillary abscess 12/22/2012  . COPD exacerbation (HCC) 12/22/2012  . Acute respiratory failure with hypoxia (HCC) 12/01/2012  . Thrombocytopenia (HCC) 12/01/2012  . Sinus tachycardia 12/01/2012  . Borderline diabetes 12/01/2012  . COPD (chronic obstructive pulmonary disease) (HCC)   . PTSD (post-traumatic stress disorder)   . Tobacco abuse     Past Surgical History:  Procedure Laterality Date  . HERNIA REPAIR     hiatal  . WISDOM TOOTH EXTRACTION         Home Medications    Prior to Admission medications   Medication Sig Start Date End Date Taking? Authorizing Provider  acetaminophen (TYLENOL) 500 MG tablet Take 500 mg by mouth 3 (three) times daily as needed (for pain.).     Historical Provider, MD  albuterol (PROVENTIL HFA;VENTOLIN HFA) 108 (90 Base) MCG/ACT inhaler Inhale 2 puffs into the lungs every 6 (six) hours as needed. 05/13/16   Benjiman CoreNathan Pickering, MD  albuterol (PROVENTIL) (5 MG/ML) 0.5% nebulizer solution Take 0.5 mLs (2.5 mg total) by nebulization every 4 (four) hours as needed for wheezing or shortness of breath. 12/26/12   Renae FickleMackenzie Short, MD  amLODipine (NORVASC) 10 MG tablet Take 1 tablet (10 mg total) by mouth daily. 05/13/16   Benjiman CoreNathan Pickering, MD  clindamycin (CLEOCIN) 300 MG capsule Take 1 capsule (300 mg total) by mouth 4 (four) times daily. 09/05/16   Derwood KaplanAnkit Tatsuo Musial, MD  fluticasone (FLONASE) 50 MCG/ACT nasal spray Place 2 sprays into both nostrils 2 (two) times daily.    Historical Provider, MD  HYDROcodone-acetaminophen (NORCO/VICODIN) 5-325 MG tablet Take 1 tablet by mouth every 6 (six) hours as needed. 09/05/16   Derwood KaplanAnkit Jaythan Hinely, MD  levofloxacin (LEVAQUIN) 750 MG tablet Take 1 tablet (750 mg total) by mouth daily. 08/27/16   Merlene Laughtermair Latif Sheikh, DO  Multiple Vitamin (MULTIVITAMIN WITH MINERALS) TABS tablet Take 1 tablet by mouth daily.    Historical Provider, MD  omeprazole (PRILOSEC) 20 MG capsule Take 20 mg by mouth 2 (two) times daily.    Historical Provider, MD  predniSONE (DELTASONE) 20 MG tablet Take 2 tablets (40 mg total) by mouth daily. 05/14/16   Benjiman CoreNathan Pickering, MD  sertraline (ZOLOFT) 100 MG tablet Take 1 tablet (100 mg total) by mouth daily. Patient not taking: Reported on 05/13/2016 05/10/15   Earney NavyJosephine C Onuoha, NP  sildenafil (VIAGRA) 100 MG tablet Take 50 mg by mouth daily as needed for erectile dysfunction.    Historical Provider, MD  tiotropium (SPIRIVA) 18 MCG inhalation capsule Place 1 capsule (18 mcg total) into inhaler and inhale daily. 05/13/16   Benjiman CoreNathan Pickering, MD    Family History Family History  Problem Relation Age of Onset  . Asthma Cousin   . Asthma Cousin   . Diabetes Brother   . Diabetes Mother     Social  History Social History  Substance Use Topics  . Smoking status: Current Every Day Smoker    Packs/day: 2.00    Years: 55.00    Types: Cigarettes  . Smokeless tobacco: Never Used  . Alcohol use 50.4 oz/week    84 Cans of beer per week     Allergies   Patient has no known allergies.   Review of Systems Review of Systems  ROS 10 Systems reviewed and are negative for acute change except as noted in the HPI.     Physical Exam Updated Vital Signs BP 163/89 (BP Location: Left Arm)   Pulse 115   Temp 98.8 F (37.1 C) (Oral)   Resp (!) 28   SpO2 94%   Physical Exam  Constitutional: He is oriented to person, place, and time. He appears well-developed.  HENT:  Head: Atraumatic.  Neck: Neck supple.  Cardiovascular: Normal rate.   Pulmonary/Chest: Effort normal.  Neurological: He is alert and oriented to person, place, and time.  Skin: Skin is warm.  Nursing note and vitals reviewed.       ED Treatments / Results  Labs (all labs ordered are listed, but only abnormal results are displayed) Labs Reviewed  CBC WITH DIFFERENTIAL/PLATELET  PROTIME-INR  I-STAT CHEM 8, ED    EKG  EKG Interpretation None       Radiology No results found.  Procedures Procedures (including critical care time)  Medications Ordered in ED Medications  HYDROcodone-acetaminophen (NORCO/VICODIN) 5-325 MG per tablet 2 tablet (2 tablets Oral Given 09/05/16 1759)     Initial Impression / Assessment and Plan / ED Course  I have reviewed the triage vital signs and the nursing notes.  Pertinent labs & imaging results that were available during my care of the patient were reviewed by me and considered in my medical decision making (see chart for details).  Clinical Course    Pt comes in with toe pain and toe darkening. He has extensive smoking hx and has advanced COPD - on hospice.  Pt appears to have an ischemic toe, and with his hx of smoking, I suspect he has an embolic event  possibly. Proximally, he has a strong DP.  We wanted to get basic workup and xrays. Also get vascular consultation.  Pt however refused care. He is Hospice pt, and believes he has doesn't have more than few months to live, and [facility].  Patient wants to leave against medical advice. Patient understands that his/her actions will lead to inadequate medical workup, and that he/she is at risk of complications of missed diagnosis, which includes morbidity and mortality and also significant infection and toe amputation.  Alternative options discussed - including pain control and transferring to [facility] if needed. Pt refused all. Patient is demonstrating good  capacity to make decision. Patient understands that he/she needs to return to the ER immediately if his/her symptoms get worse.     Final Clinical Impressions(s) / ED Diagnoses   Final diagnoses:  Ischemic toe    New Prescriptions New Prescriptions   CLINDAMYCIN (CLEOCIN) 300 MG CAPSULE    Take 1 capsule (300 mg total) by mouth 4 (four) times daily.   HYDROCODONE-ACETAMINOPHEN (NORCO/VICODIN) 5-325 MG TABLET    Take 1 tablet by mouth every 6 (six) hours as needed.     Derwood Kaplan, MD 09/05/16 [phone]

## 2016-09-06 ENCOUNTER — Encounter (HOSPITAL_COMMUNITY): Payer: Self-pay | Admitting: Emergency Medicine

## 2016-09-06 ENCOUNTER — Emergency Department (HOSPITAL_COMMUNITY)
Admission: EM | Admit: 2016-09-06 | Discharge: 2016-09-06 | Disposition: A | Discharge status: Home / Self Care | Attending: Emergency Medicine | Admitting: Emergency Medicine

## 2016-09-06 ENCOUNTER — Emergency Department (HOSPITAL_COMMUNITY)

## 2016-09-06 DIAGNOSIS — I10 Essential (primary) hypertension: Secondary | ICD-10-CM | POA: Insufficient documentation

## 2016-09-06 DIAGNOSIS — J449 Chronic obstructive pulmonary disease, unspecified: Secondary | ICD-10-CM | POA: Diagnosis not present

## 2016-09-06 DIAGNOSIS — M79675 Pain in left toe(s): Secondary | ICD-10-CM | POA: Diagnosis present

## 2016-09-06 DIAGNOSIS — I998 Other disorder of circulatory system: Secondary | ICD-10-CM | POA: Insufficient documentation

## 2016-09-06 DIAGNOSIS — F17210 Nicotine dependence, cigarettes, uncomplicated: Secondary | ICD-10-CM | POA: Insufficient documentation

## 2016-09-06 LAB — CBC WITH DIFFERENTIAL/PLATELET
BASOS ABS: 0 10*3/uL (ref 0.0–0.1)
BASOS PCT: 0 %
EOS ABS: 0 10*3/uL (ref 0.0–0.7)
EOS PCT: 0 %
HCT: 47.1 % (ref 39.0–52.0)
Hemoglobin: 15.4 g/dL (ref 13.0–17.0)
Lymphocytes Relative: 5 %
Lymphs Abs: 0.5 10*3/uL — ABNORMAL LOW (ref 0.7–4.0)
MCH: 28.5 pg (ref 26.0–34.0)
MCHC: 32.7 g/dL (ref 30.0–36.0)
MCV: 87.1 fL (ref 78.0–100.0)
MONO ABS: 0.2 10*3/uL (ref 0.1–1.0)
Monocytes Relative: 2 %
Neutro Abs: 9.9 10*3/uL — ABNORMAL HIGH (ref 1.7–7.7)
Neutrophils Relative %: 93 %
PLATELETS: 122 10*3/uL — AB (ref 150–400)
RBC: 5.41 MIL/uL (ref 4.22–5.81)
RDW: 16.5 % — AB (ref 11.5–15.5)
WBC: 10.7 10*3/uL — AB (ref 4.0–10.5)

## 2016-09-06 LAB — BASIC METABOLIC PANEL
ANION GAP: 11 (ref 5–15)
BUN: 15 mg/dL (ref 6–20)
CALCIUM: 8.6 mg/dL — AB (ref 8.9–10.3)
CO2: 30 mmol/L (ref 22–32)
Chloride: 95 mmol/L — ABNORMAL LOW (ref 101–111)
Creatinine, Ser: 0.87 mg/dL (ref 0.61–1.24)
GLUCOSE: 144 mg/dL — AB (ref 65–99)
POTASSIUM: 4.3 mmol/L (ref 3.5–5.1)
Sodium: 136 mmol/L (ref 135–145)

## 2016-09-06 LAB — I-STAT CG4 LACTIC ACID, ED: LACTIC ACID, VENOUS: 2.62 mmol/L — AB (ref 0.5–1.9)

## 2016-09-06 LAB — I-STAT CHEM 8, ED
BUN: 22 mg/dL — ABNORMAL HIGH (ref 6–20)
Calcium, Ion: 1.02 mmol/L — ABNORMAL LOW (ref 1.15–1.40)
Chloride: 93 mmol/L — ABNORMAL LOW (ref 101–111)
Creatinine, Ser: 0.9 mg/dL (ref 0.61–1.24)
GLUCOSE: 145 mg/dL — AB (ref 65–99)
HCT: 46 % (ref 39.0–52.0)
HEMOGLOBIN: 15.6 g/dL (ref 13.0–17.0)
POTASSIUM: 4.4 mmol/L (ref 3.5–5.1)
SODIUM: 135 mmol/L (ref 135–145)
TCO2: 34 mmol/L (ref 0–100)

## 2016-09-06 MED ORDER — HYDROCODONE-ACETAMINOPHEN 5-325 MG PO TABS
1.0000 | ORAL_TABLET | Freq: Once | ORAL | Status: AC
  Administered 2016-09-06: 1 via ORAL
  Filled 2016-09-06: qty 1

## 2016-09-06 MED ORDER — HYDROCODONE-ACETAMINOPHEN 5-325 MG PO TABS: 1.0000 | ORAL_TABLET | Freq: Four times a day (QID) | ORAL | 0 refills | Status: AC | PRN

## 2016-09-06 NOTE — Consult Note (Signed)
[facility]  Patient name: [name] [name] MRN: [id number] DOB: 13-Sep-1946 Sex: male  REASON FOR CONSULT: Ischemic toe, consult is from EDP  HPI: [name] [name] is a 70 y.o. male, who presents with 2 day history of left great toe pain and progressive cyanosis. The patient denies any injury to his foot. 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 He expects to live 6 months to a year. He is unable to ambulate more than 10 feet due to breathing issues. He is on intermittent home O2. He denies any prior history of nonhealing wounds. He denies any rest pain. He denies any prior history of cardiac arrhythmias. He has no prior history of CAD or CVA. He smokes 2 packs of cigarettes per day. He is not diabetic. He is DNR/DNI.   He was recently admitted on 08/27/2016 due to altered mental status and COPD exacerbation. He was placed on BiPAP. Palliative care was consulted for goals of care. Recommendations were made for no escalation of care, "treat what is treatable/reversible." He was discharged on 08/27/2016 with comfort care.   Past Medical History:  Diagnosis Date  . Asthma   . COPD (chronic obstructive pulmonary disease) (HCC)   . Emphysema   . Hypertension   . Pneumonia   . PTSD (post-traumatic stress disorder)   . Tobacco abuse     Family History  Problem Relation Age of Onset  . Asthma Cousin   . Asthma Cousin   . Diabetes Brother   . Diabetes Mother     SOCIAL HISTORY: Social History   Social History  . Marital status: Single    Spouse name: N/A  . Number of children: N/A  . Years of education: N/A   Occupational History  . Not on file.   Social History Main Topics  . Smoking status: Current Every Day Smoker    Packs/day: 2.00    Years: 55.00    Types: Cigarettes  . Smokeless tobacco: Never Used  . Alcohol use 50.4 oz/week    84 Cans of beer per week  . Drug use: No  . Sexual activity: Not on file   Other Topics Concern  . Not on file    Social History Narrative   [place]  Owns horses.  [facility] care is from the [facility].      No Known Allergies  No current facility-administered medications for this encounter.    Current Outpatient Prescriptions  Medication Sig Dispense Refill  . acetaminophen (TYLENOL) 500 MG tablet Take 500 mg by mouth 3 (three) times daily as needed (for pain.).    Marland Kitchen. albuterol (PROVENTIL HFA;VENTOLIN HFA) 108 (90 Base) MCG/ACT inhaler Inhale 2 puffs into the lungs every 6 (six) hours as needed. 6.7 Inhaler 0  . albuterol (PROVENTIL) (5 MG/ML) 0.5% nebulizer solution Take 0.5 mLs (2.5 mg total) by nebulization every 4 (four) hours as needed for wheezing or shortness of breath. 20 mL 1  . amLODipine (NORVASC) 10 MG tablet Take 1 tablet (10 mg total) by mouth daily. 30 tablet 0  . clindamycin (CLEOCIN) 300 MG capsule Take 1 capsule (300 mg total) by mouth 4 (four) times daily. 28 capsule 0  . fluticasone (FLONASE) 50 MCG/ACT nasal spray Place 2 sprays into both nostrils 2 (two) times daily.    Marland Kitchen. HYDROcodone-acetaminophen (NORCO/VICODIN) 5-325 MG tablet Take 1 tablet by mouth every 6 (six) hours as needed. 8 tablet 0  . HYDROcodone-acetaminophen (NORCO/VICODIN) 5-325  MG tablet Take 1-2 tablets by mouth every 6 (six) hours as needed for severe pain. 15 tablet 0  . levofloxacin (LEVAQUIN) 750 MG tablet Take 1 tablet (750 mg total) by mouth daily. 13 tablet 0  . Multiple Vitamin (MULTIVITAMIN WITH MINERALS) TABS tablet Take 1 tablet by mouth daily.    Marland Kitchen omeprazole (PRILOSEC) 20 MG capsule Take 20 mg by mouth 2 (two) times daily.    . predniSONE (DELTASONE) 20 MG tablet Take 2 tablets (40 mg total) by mouth daily. 8 tablet 0  . sertraline (ZOLOFT) 100 MG tablet Take 1 tablet (100 mg total) by mouth daily. (Patient not taking: Reported on 05/13/2016) 15 tablet 0  . sildenafil (VIAGRA) 100 MG tablet Take 50 mg by mouth daily as needed for erectile dysfunction.    Marland Kitchen tiotropium  (SPIRIVA) 18 MCG inhalation capsule Place 1 capsule (18 mcg total) into inhaler and inhale daily. 30 capsule 0    REVIEW OF SYSTEMS:  [X]  denotes positive finding, [ ]  denotes negative finding Cardiac  Comments:  Chest pain or chest pressure:    Shortness of breath upon exertion:    Short of breath when lying flat: x   Irregular heart rhythm:        Vascular    Pain in calf, thigh, or hip brought on by ambulation:    Pain in feet at night that wakes you up from your sleep:     Blood clot in your veins:    Leg swelling:         Pulmonary    Oxygen at home: x   Productive cough:     Wheezing:  x       Neurologic    Sudden weakness in arms or legs:     Sudden numbness in arms or legs:     Sudden onset of difficulty speaking or slurred speech:    Temporary loss of vision in one eye:     Problems with dizziness:         Gastrointestinal    Blood in stool:     Vomited blood:         Genitourinary    Burning when urinating:     Blood in urine:        Psychiatric    Major depression:         Hematologic    Bleeding problems:    Problems with blood clotting too easily:        Skin    Rashes or ulcers:        Constitutional    Fever or chills:      PHYSICAL EXAM: Vitals:   09/06/16 1600 09/06/16 1615 09/06/16 1630 09/06/16 1758  BP: 145/86 152/93 153/95 151/94  Pulse: 96 98 100 98  Resp: 16 18 18 18   Temp:    97.4 F (36.3 C)  TempSrc:      SpO2: 96% 95% 95%     GENERAL: The patient is a well developed male, in no acute distress. The vital signs are documented above. CARDIAC: There is a regular rate and rhythm.  VASCULAR: Palpable femoral pulses bilaterally. On the left, it is the extremity of concern, there is a non-palpable left popliteal pulse and non-palpable pedal pulses. There is monophasic left DP and PT Doppler signals. Palpable right popliteal pulse. Biphasic right DP and PT signals. Feet are warm bilaterally. PULMONARY: Audible wheezing with increased  respiratory rate, expiratory wheezing on auscultation ABDOMEN: Soft and non-tender. MUSCULOSKELETAL: Cyanotic  left great toe and plantar aspect of left fifth toe. NEUROLOGIC: Motor and sensory function intact to bilateral lower extremities. PSYCHIATRIC: The patient has a normal affect.   MEDICAL ISSUES: Ischemic left great toe  Patient has advanced COPD on hospice. He has a 2 day history of left great toe pain with worsening cyanosis. He has no prior history of cardiac arrhythmia. He has no history of claudication although his pulmonary status limits his ambulation. He does have audible Doppler flow to the left foot with monophasic signals. Discussed that this is likely an embolic event. Discussed multiple options with the patient. Discussed outpatient arteriogram to evaluate blood flow to left leg. Discussed that if there are no percutaneous options for revascularization, he would not be a candidate for any surgical intervention given his pulmonary status. Also discussed admission for thromboembolic workup. Also discussed conservative management with pain management and observation. Patient does not want to pursue any further workup given his advanced pulmonary disease. He states that he has probably 6 months to live. He understands that he may have another embolic event in the future. He understands that his toe may require amputation in the future. He is willing to accept the risk. He is otherwise stable and his pulmonary status is at baseline. He may be discharged home.  Maris BergerKimberly Trinh, PA-C [facility] [phone]   This patient left prior to me examining but per report did not want offered intervention or arteriogram. Can f/u prn if he changes his mind.   Ovetta Bazzano C. Randie Heinzain, MD [facility] Office: [phone] Pager: [phone]

## 2016-09-06 NOTE — Discharge Instructions (Signed)
You have a blood clot in the artery of your left great toe. It is not getting enough blood flow which is why it hurts and is blue. You [facility] for further care. Use her pain medication as needed. Return to the hospital a few start passing out, running fever or low blood pressure. He can also could return for uncontrolled pain. Staying contact with hospice for help at home

## 2016-09-06 NOTE — ED Notes (Signed)
Hospice after hour service called left nurse number for possible transport. Patient states he could also possibly take a taxi.

## 2016-09-06 NOTE — ED Triage Notes (Signed)
Hospice patient from home with [facility] for left big toe pain. [facility] same. [facility] reports that patient called out for toe pain, hx of copd witnessed smoking a cigarette. Wheezes noted but pt refused breathing treatment.   Pt states his hospice RN is aware that he is here.  VSS  168/92 bp 82 hr 18 resp 99% on 4L  Normally on RA

## 2016-09-06 NOTE — ED Notes (Signed)
Pt states he understands instructions. All questions answered. Home stable via wc.

## 2016-09-06 NOTE — ED Provider Notes (Signed)
[facility]-EMERGENCY DEPT Provider Note   CSN: [id number] Arrival date & time: 09/06/16  1535     History   Chief Complaint Chief Complaint  Patient presents with  . Toe Pain  . Hospice Patient    HPI [name] [name] is a 70 y.o. male.  70 yo M with a chief complaint of left great toe pain. This been going on for a few days. He was seen in the ED yesterday with the same. At that time he declined all medical intervention and is wanted some pain medicine to go home with. He is on hospice and was going to see the [facility] today. When he went to the [facility] was closed to [facility]. He then decided check back in to the ED because the pain was so severe last night. He currently denies any pain. Denies injury denies fevers or chills denies nausea or vomiting.   The history is provided by the patient.  Toe Pain  This is a new problem. The current episode started 2 days ago. The problem occurs constantly. The problem has been gradually worsening. Pertinent negatives include no chest pain, no abdominal pain, no headaches and no shortness of breath. Nothing aggravates the symptoms. Nothing relieves the symptoms. He has tried nothing for the symptoms. The treatment provided no relief.    Past Medical History:  Diagnosis Date  . Asthma   . COPD (chronic obstructive pulmonary disease) (HCC)   . Emphysema   . Hypertension   . Pneumonia   . PTSD (post-traumatic stress disorder)   . Tobacco abuse     Patient Active Problem List   Diagnosis Date Noted  . Acute on chronic respiratory failure with hypercapnia (HCC) 08/27/2016  . CAP (community acquired pneumonia) 08/27/2016  . Sepsis (HCC) 08/27/2016  . Respiratory failure (HCC) 08/27/2016  . Goals of care, counseling/discussion   . Palliative care by specialist   . Homicidal ideation   . Involuntary commitment   . Mood disorder (HCC) 05/10/2015  . Elevated blood pressure 12/25/2012  . MRSA cellulitis 12/22/2012  . Hyponatremia 12/22/2012  .  Axillary abscess 12/22/2012  . COPD exacerbation (HCC) 12/22/2012  . Acute respiratory failure with hypoxia (HCC) 12/01/2012  . Thrombocytopenia (HCC) 12/01/2012  . Sinus tachycardia 12/01/2012  . Borderline diabetes 12/01/2012  . COPD (chronic obstructive pulmonary disease) (HCC)   . PTSD (post-traumatic stress disorder)   . Tobacco abuse     Past Surgical History:  Procedure Laterality Date  . HERNIA REPAIR     hiatal  . WISDOM TOOTH EXTRACTION         Home Medications    Prior to Admission medications   Medication Sig Start Date End Date Taking? Authorizing Provider  acetaminophen (TYLENOL) 500 MG tablet Take 500 mg by mouth 3 (three) times daily as needed (for pain.).    Historical Provider, MD  albuterol (PROVENTIL HFA;VENTOLIN HFA) 108 (90 Base) MCG/ACT inhaler Inhale 2 puffs into the lungs every 6 (six) hours as needed. 05/13/16   Benjiman Core, MD  albuterol (PROVENTIL) (5 MG/ML) 0.5% nebulizer solution Take 0.5 mLs (2.5 mg total) by nebulization every 4 (four) hours as needed for wheezing or shortness of breath. 12/26/12   Renae Fickle, MD  amLODipine (NORVASC) 10 MG tablet Take 1 tablet (10 mg total) by mouth daily. 05/13/16   Benjiman Core, MD  clindamycin (CLEOCIN) 300 MG capsule Take 1 capsule (300 mg total) by mouth 4 (four) times daily. 09/05/16   Derwood Kaplan, MD  fluticasone Aleda Grana)  50 MCG/ACT nasal spray Place 2 sprays into both nostrils 2 (two) times daily.    Historical Provider, MD  HYDROcodone-acetaminophen (NORCO/VICODIN) 5-325 MG tablet Take 1 tablet by mouth every 6 (six) hours as needed. 09/05/16   Derwood KaplanAnkit Nanavati, MD  HYDROcodone-acetaminophen (NORCO/VICODIN) 5-325 MG tablet Take 1-2 tablets by mouth every 6 (six) hours as needed for severe pain. 09/06/16   Gwyneth SproutWhitney Plunkett, MD  levofloxacin (LEVAQUIN) 750 MG tablet Take 1 tablet (750 mg total) by mouth daily. 08/27/16   Merlene Laughtermair Latif Sheikh, DO  Multiple Vitamin (MULTIVITAMIN WITH MINERALS) TABS  tablet Take 1 tablet by mouth daily.    Historical Provider, MD  omeprazole (PRILOSEC) 20 MG capsule Take 20 mg by mouth 2 (two) times daily.    Historical Provider, MD  predniSONE (DELTASONE) 20 MG tablet Take 2 tablets (40 mg total) by mouth daily. 05/14/16   Benjiman CoreNathan Pickering, MD  sertraline (ZOLOFT) 100 MG tablet Take 1 tablet (100 mg total) by mouth daily. Patient not taking: Reported on 05/13/2016 05/10/15   Earney NavyJosephine C Onuoha, NP  sildenafil (VIAGRA) 100 MG tablet Take 50 mg by mouth daily as needed for erectile dysfunction.    Historical Provider, MD  tiotropium (SPIRIVA) 18 MCG inhalation capsule Place 1 capsule (18 mcg total) into inhaler and inhale daily. 05/13/16   Benjiman CoreNathan Pickering, MD    Family History Family History  Problem Relation Age of Onset  . Asthma Cousin   . Asthma Cousin   . Diabetes Brother   . Diabetes Mother     Social History Social History  Substance Use Topics  . Smoking status: Current Every Day Smoker    Packs/day: 2.00    Years: 55.00    Types: Cigarettes  . Smokeless tobacco: Never Used  . Alcohol use 50.4 oz/week    84 Cans of beer per week     Allergies   Patient has no known allergies.   Review of Systems Review of Systems  Constitutional: Negative for chills and fever.  HENT: Negative for congestion and facial swelling.   Eyes: Negative for discharge and visual disturbance.  Respiratory: Negative for shortness of breath.   Cardiovascular: Negative for chest pain and palpitations.  Gastrointestinal: Negative for abdominal pain, diarrhea and vomiting.  Musculoskeletal: Negative for arthralgias and myalgias.       L great toe pain  Skin: Positive for color change. Negative for rash.  Neurological: Negative for tremors, syncope and headaches.  Psychiatric/Behavioral: Negative for confusion and dysphoric mood.     Physical Exam Updated Vital Signs BP 151/94   Pulse 98   Temp 97.4 F (36.3 C)   Resp 18   SpO2 95%   Physical Exam    Constitutional: He is oriented to person, place, and time. He appears well-developed and well-nourished.  HENT:  Head: Normocephalic and atraumatic.  Eyes: EOM are normal. Pupils are equal, round, and reactive to light.  Neck: Normal range of motion. Neck supple. No JVD present.  Cardiovascular: Normal rate and regular rhythm.  Exam reveals no gallop and no friction rub.   No murmur heard. Pulmonary/Chest: No respiratory distress. He has no wheezes.  Abdominal: He exhibits no distension and no mass. There is no tenderness. There is no rebound and no guarding.  Musculoskeletal: Normal range of motion.  Left great toe with some grayish discoloration. He is tender to the distal aspect of the toe underneath the nail. He has a dorsalis pedis pulse but is much weaker than the right.  Neurological: He is alert and oriented to person, place, and time.  Skin: No rash noted. No pallor.  Psychiatric: He has a normal mood and affect. His behavior is normal.  Nursing note and vitals reviewed.    ED Treatments / Results  Labs (all labs ordered are listed, but only abnormal results are displayed) Labs Reviewed  CBC WITH DIFFERENTIAL/PLATELET - Abnormal; Notable for the following:       Result Value   WBC 10.7 (*)    RDW 16.5 (*)    Platelets 122 (*)    Neutro Abs 9.9 (*)    Lymphs Abs 0.5 (*)    All other components within normal limits  BASIC METABOLIC PANEL - Abnormal; Notable for the following:    Chloride 95 (*)    Glucose, Bld 144 (*)    Calcium 8.6 (*)    All other components within normal limits  I-STAT CG4 LACTIC ACID, ED - Abnormal; Notable for the following:    Lactic Acid, Venous 2.62 (*)    All other components within normal limits  I-STAT CHEM 8, ED - Abnormal; Notable for the following:    Chloride 93 (*)    BUN 22 (*)    Glucose, Bld 145 (*)    Calcium, Ion 1.02 (*)    All other components within normal limits    EKG  EKG Interpretation None       Radiology Dg  Foot Complete Left  Result Date: 09/06/2016 CLINICAL DATA:  Distal great toe pain for 3 days. Discoloration. No acute injury or history of diabetes. EXAM: LEFT FOOT - COMPLETE 3+ VIEW COMPARISON:  None. FINDINGS: The mineralization and alignment are normal. There is no evidence of acute fracture or dislocation. There is no evidence of bone destruction. There is evidence of an old fracture of the distal fibula. Mild midfoot degenerative changes are present. No soft tissue emphysema or foreign body seen. IMPRESSION: No acute findings.  No radiographic evidence of osteomyelitis. Electronically Signed   By: Carey BullocksWilliam  Veazey M.D.   On: 09/06/2016 16:56    Procedures Procedures (including critical care time)  Medications Ordered in ED Medications  HYDROcodone-acetaminophen (NORCO/VICODIN) 5-325 MG per tablet 1 tablet (1 tablet Oral Given 09/06/16 1754)     Initial Impression / Assessment and Plan / ED Course  I have reviewed the triage vital signs and the nursing notes.  Pertinent labs & imaging results that were available during my care of the patient were reviewed by me and considered in my medical decision making (see chart for details).  Clinical Course     70 yo M With a chief complaint of left great toe pain. Patient is a COPD patient on hospice. Continues to smoke. Patient is concerned about his toe having severe pain. Will obtain a basic lab evaluation plain film.   Vascular to see the patient.   Turned over to Dr. Anitra LauthPlunkett.  The patients results and plan were reviewed and discussed.   Any x-rays performed were independently reviewed by myself.   Differential diagnosis were considered with the presenting HPI.  Medications  HYDROcodone-acetaminophen (NORCO/VICODIN) 5-325 MG per tablet 1 tablet (1 tablet Oral Given 09/06/16 1754)    Vitals:   09/06/16 1600 09/06/16 1615 09/06/16 1630 09/06/16 1758  BP: 145/86 152/93 153/95 151/94  Pulse: 96 98 100 98  Resp: 16 18 18 18     Temp:    97.4 F (36.3 C)  TempSrc:      SpO2: 96% 95% 95%  Final diagnoses:  Ischemic toe      Final Clinical Impressions(s) / ED Diagnoses   Final diagnoses:  Ischemic toe    New Prescriptions Discharge Medication List as of 09/06/2016  5:50 PM    START taking these medications   Details  !! HYDROcodone-acetaminophen (NORCO/VICODIN) 5-325 MG tablet Take 1-2 tablets by mouth every 6 (six) hours as needed for severe pain., Starting Fri 09/06/2016, Print     !! - Potential duplicate medications found. Please discuss with provider.       Melene Plan, DO 09/07/16 [phone]

## 2016-09-06 NOTE — ED Notes (Signed)
ED Provider at bedside. 

## 2016-09-06 NOTE — ED Notes (Signed)
Hospice called do not have transportation would have to take non emergency ambulance, taxi, or family. Patient stated will call a cab.

## 2016-09-06 NOTE — ED Provider Notes (Signed)
Patient seen by vascular surgery and given the option of admission for pain control, cardiac workup and imaging to evaluate what caused the ischemic toe. Patient is of sound mind in understands options. He is refusing these at this time. He does not want further evaluation and is requesting to go home with pain medication. There is no urgent need to amputate his toe at this time. [facility]   Gwyneth SproutWhitney Doris Gruhn, MD 09/06/16 1739

## 2016-09-14 ENCOUNTER — Inpatient Hospital Stay (HOSPITAL_COMMUNITY)
Admission: EM | Admit: 2016-09-14 | Discharge: 2016-09-27 | DRG: 190 | Disposition: E | Attending: Family Medicine | Admitting: Family Medicine

## 2016-09-14 ENCOUNTER — Emergency Department (HOSPITAL_COMMUNITY)

## 2016-09-14 ENCOUNTER — Encounter (HOSPITAL_COMMUNITY): Payer: Self-pay

## 2016-09-14 DIAGNOSIS — D696 Thrombocytopenia, unspecified: Secondary | ICD-10-CM | POA: Diagnosis present

## 2016-09-14 DIAGNOSIS — R0603 Acute respiratory distress: Secondary | ICD-10-CM | POA: Diagnosis present

## 2016-09-14 DIAGNOSIS — F17210 Nicotine dependence, cigarettes, uncomplicated: Secondary | ICD-10-CM | POA: Diagnosis present

## 2016-09-14 DIAGNOSIS — I10 Essential (primary) hypertension: Secondary | ICD-10-CM | POA: Diagnosis not present

## 2016-09-14 DIAGNOSIS — I509 Heart failure, unspecified: Secondary | ICD-10-CM | POA: Diagnosis present

## 2016-09-14 DIAGNOSIS — Z833 Family history of diabetes mellitus: Secondary | ICD-10-CM

## 2016-09-14 DIAGNOSIS — R000 Tachycardia, unspecified: Secondary | ICD-10-CM | POA: Diagnosis present

## 2016-09-14 DIAGNOSIS — J441 Chronic obstructive pulmonary disease with (acute) exacerbation: Principal | ICD-10-CM | POA: Diagnosis present

## 2016-09-14 DIAGNOSIS — Z515 Encounter for palliative care: Secondary | ICD-10-CM | POA: Diagnosis present

## 2016-09-14 DIAGNOSIS — Z66 Do not resuscitate: Secondary | ICD-10-CM | POA: Diagnosis present

## 2016-09-14 DIAGNOSIS — F4310 Post-traumatic stress disorder, unspecified: Secondary | ICD-10-CM | POA: Diagnosis present

## 2016-09-14 DIAGNOSIS — R7303 Prediabetes: Secondary | ICD-10-CM | POA: Diagnosis not present

## 2016-09-14 DIAGNOSIS — J9621 Acute and chronic respiratory failure with hypoxia: Secondary | ICD-10-CM | POA: Diagnosis present

## 2016-09-14 DIAGNOSIS — J9622 Acute and chronic respiratory failure with hypercapnia: Secondary | ICD-10-CM | POA: Diagnosis not present

## 2016-09-14 DIAGNOSIS — Z8614 Personal history of Methicillin resistant Staphylococcus aureus infection: Secondary | ICD-10-CM | POA: Diagnosis not present

## 2016-09-14 DIAGNOSIS — I998 Other disorder of circulatory system: Secondary | ICD-10-CM | POA: Diagnosis not present

## 2016-09-14 DIAGNOSIS — I110 Hypertensive heart disease with heart failure: Secondary | ICD-10-CM | POA: Diagnosis present

## 2016-09-14 HISTORY — DX: Cellulitis, unspecified: L03.90

## 2016-09-14 HISTORY — DX: Methicillin resistant Staphylococcus aureus infection as the cause of diseases classified elsewhere: B95.62

## 2016-09-14 HISTORY — DX: Heart failure, unspecified: I50.9

## 2016-09-14 LAB — I-STAT ARTERIAL BLOOD GAS, ED
ACID-BASE EXCESS: 11 mmol/L — AB (ref 0.0–2.0)
Acid-Base Excess: 13 mmol/L — ABNORMAL HIGH (ref 0.0–2.0)
BICARBONATE: 41.2 mmol/L — AB (ref 20.0–28.0)
Bicarbonate: 44 mmol/L — ABNORMAL HIGH (ref 20.0–28.0)
O2 Saturation: 100 %
O2 Saturation: 97 %
PCO2 ART: 87.7 mmHg — AB (ref 32.0–48.0)
PCO2 ART: 77.6 mmHg — AB (ref 32.0–48.0)
PH ART: 7.308 — AB (ref 7.350–7.450)
PO2 ART: 207 mmHg — AB (ref 83.0–108.0)
PO2 ART: 99 mmHg (ref 83.0–108.0)
Patient temperature: 98.6
Patient temperature: 98.6
TCO2: 47 mmol/L (ref 0–100)
TCO2: 43 mmol/L (ref 0–100)
pH, Arterial: 7.333 — ABNORMAL LOW (ref 7.350–7.450)

## 2016-09-14 LAB — CBC WITH DIFFERENTIAL/PLATELET
BASOS PCT: 0 %
Basophils Absolute: 0 10*3/uL (ref 0.0–0.1)
EOS ABS: 0.1 10*3/uL (ref 0.0–0.7)
Eosinophils Relative: 0 %
HEMATOCRIT: 51.6 % (ref 39.0–52.0)
HEMOGLOBIN: 16.1 g/dL (ref 13.0–17.0)
LYMPHS ABS: 1.2 10*3/uL (ref 0.7–4.0)
Lymphocytes Relative: 6 %
MCH: 28.6 pg (ref 26.0–34.0)
MCHC: 31.2 g/dL (ref 30.0–36.0)
MCV: 91.8 fL (ref 78.0–100.0)
MONO ABS: 0.7 10*3/uL (ref 0.1–1.0)
MONOS PCT: 4 %
NEUTROS ABS: 16.6 10*3/uL — AB (ref 1.7–7.7)
Neutrophils Relative %: 90 %
Platelets: 140 10*3/uL — ABNORMAL LOW (ref 150–400)
RBC: 5.62 MIL/uL (ref 4.22–5.81)
RDW: 17.2 % — AB (ref 11.5–15.5)
WBC: 18.5 10*3/uL — ABNORMAL HIGH (ref 4.0–10.5)

## 2016-09-14 LAB — I-STAT CG4 LACTIC ACID, ED: Lactic Acid, Venous: 1.18 mmol/L (ref 0.5–1.9)

## 2016-09-14 LAB — COMPREHENSIVE METABOLIC PANEL
ALBUMIN: 3.7 g/dL (ref 3.5–5.0)
ALK PHOS: 38 U/L (ref 38–126)
ALT: 24 U/L (ref 17–63)
AST: 18 U/L (ref 15–41)
Anion gap: 8 (ref 5–15)
BILIRUBIN TOTAL: 1 mg/dL (ref 0.3–1.2)
BUN: 20 mg/dL (ref 6–20)
CO2: 37 mmol/L — ABNORMAL HIGH (ref 22–32)
CREATININE: 0.93 mg/dL (ref 0.61–1.24)
Calcium: 8.7 mg/dL — ABNORMAL LOW (ref 8.9–10.3)
Chloride: 95 mmol/L — ABNORMAL LOW (ref 101–111)
GFR calc Af Amer: >60 mL/min (ref >60)
GLUCOSE: 111 mg/dL — AB (ref 65–99)
Potassium: 4.3 mmol/L (ref 3.5–5.1)
Sodium: 140 mmol/L (ref 135–145)
TOTAL PROTEIN: 6.3 g/dL — AB (ref 6.5–8.1)

## 2016-09-14 LAB — I-STAT TROPONIN, ED: Troponin i, poc: 0.05 ng/mL (ref 0.00–0.08)

## 2016-09-14 LAB — GLUCOSE, CAPILLARY
Glucose-Capillary: 154 mg/dL — ABNORMAL HIGH (ref 65–99)
Glucose-Capillary: 130 mg/dL — ABNORMAL HIGH (ref 65–99)

## 2016-09-14 LAB — BRAIN NATRIURETIC PEPTIDE: B Natriuretic Peptide: 108.5 pg/mL — ABNORMAL HIGH (ref 0.0–100.0)

## 2016-09-14 LAB — LIPASE, BLOOD: LIPASE: 22 U/L (ref 11–51)

## 2016-09-14 MED ORDER — ALBUTEROL (5 MG/ML) CONTINUOUS INHALATION SOLN
INHALATION_SOLUTION | RESPIRATORY_TRACT | Status: AC
  Filled 2016-09-14: qty 20

## 2016-09-14 MED ORDER — GUAIFENESIN ER 600 MG PO TB12
600.0000 mg | ORAL_TABLET | Freq: Two times a day (BID) | ORAL | Status: DC
  Administered 2016-09-15 – 2016-09-16 (×3): 600 mg via ORAL
  Filled 2016-09-14 (×3): qty 1

## 2016-09-14 MED ORDER — IPRATROPIUM-ALBUTEROL 0.5-2.5 (3) MG/3ML IN SOLN
3.0000 mL | Freq: Four times a day (QID) | RESPIRATORY_TRACT | Status: DC
  Administered 2016-09-14 – 2016-09-16 (×7): 3 mL via RESPIRATORY_TRACT
  Filled 2016-09-14 (×7): qty 3

## 2016-09-14 MED ORDER — AZITHROMYCIN 250 MG PO TABS: 500.0000 mg | ORAL_TABLET | Freq: Every day | ORAL | Status: DC

## 2016-09-14 MED ORDER — LORAZEPAM 2 MG/ML IJ SOLN
INTRAMUSCULAR | Status: AC
Start: 2016-09-14 — End: 2016-09-14
  Filled 2016-09-14: qty 1

## 2016-09-14 MED ORDER — AMLODIPINE BESYLATE 10 MG PO TABS
10.0000 mg | ORAL_TABLET | Freq: Every day | ORAL | Status: DC
  Administered 2016-09-15 – 2016-09-16 (×2): 10 mg via ORAL
  Filled 2016-09-14 (×2): qty 1

## 2016-09-14 MED ORDER — INSULIN ASPART 100 UNIT/ML ~~LOC~~ SOLN
0.0000 [IU] | Freq: Four times a day (QID) | SUBCUTANEOUS | Status: DC
  Administered 2016-09-14: 3 [IU] via SUBCUTANEOUS
  Administered 2016-09-15 – 2016-09-16 (×4): 2 [IU] via SUBCUTANEOUS

## 2016-09-14 MED ORDER — ALBUTEROL SULFATE (2.5 MG/3ML) 0.083% IN NEBU
2.5000 mg | INHALATION_SOLUTION | RESPIRATORY_TRACT | Status: DC | PRN
  Administered 2016-09-15 – 2016-09-16 (×5): 2.5 mg via RESPIRATORY_TRACT
  Filled 2016-09-14 (×8): qty 3

## 2016-09-14 MED ORDER — IPRATROPIUM BROMIDE 0.02 % IN SOLN
0.5000 mg | Freq: Four times a day (QID) | RESPIRATORY_TRACT | Status: DC
Start: 2016-09-14 — End: 2016-09-14
  Administered 2016-09-14: 0.5 mg via RESPIRATORY_TRACT
  Filled 2016-09-14: qty 2.5

## 2016-09-14 MED ORDER — ALBUTEROL SULFATE (2.5 MG/3ML) 0.083% IN NEBU
2.5000 mg | INHALATION_SOLUTION | Freq: Four times a day (QID) | RESPIRATORY_TRACT | Status: DC
  Administered 2016-09-14: 2.5 mg via RESPIRATORY_TRACT
  Filled 2016-09-14: qty 3

## 2016-09-14 MED ORDER — SODIUM CHLORIDE 0.9 % IV SOLN
INTRAVENOUS | Status: DC
  Administered 2016-09-14: 17:00:00 via INTRAVENOUS

## 2016-09-14 MED ORDER — MENTHOL 3 MG MT LOZG
1.0000 | LOZENGE | OROMUCOSAL | Status: DC | PRN
  Filled 2016-09-14: qty 9

## 2016-09-14 MED ORDER — FLUTICASONE PROPIONATE 50 MCG/ACT NA SUSP
2.0000 | Freq: Two times a day (BID) | NASAL | Status: DC
  Administered 2016-09-15 – 2016-09-17 (×5): 2 via NASAL
  Filled 2016-09-14: qty 16

## 2016-09-14 MED ORDER — ACETAMINOPHEN 650 MG RE SUPP: 650.0000 mg | Freq: Four times a day (QID) | RECTAL | Status: DC | PRN

## 2016-09-14 MED ORDER — MAGNESIUM SULFATE 2 GM/50ML IV SOLN
2.0000 g | Freq: Once | INTRAVENOUS | Status: AC
  Administered 2016-09-14: 2 g via INTRAVENOUS
  Filled 2016-09-14: qty 50

## 2016-09-14 MED ORDER — SODIUM CHLORIDE 0.9 % IV BOLUS (SEPSIS): 500.0000 mL | Freq: Once | INTRAVENOUS | Status: DC

## 2016-09-14 MED ORDER — TIOTROPIUM BROMIDE MONOHYDRATE 18 MCG IN CAPS: 18.0000 ug | ORAL_CAPSULE | Freq: Every day | RESPIRATORY_TRACT | Status: DC

## 2016-09-14 MED ORDER — LORAZEPAM 2 MG/ML IJ SOLN
0.5000 mg | Freq: Once | INTRAMUSCULAR | Status: AC
  Administered 2016-09-14: 0.5 mg via INTRAVENOUS

## 2016-09-14 MED ORDER — ENOXAPARIN SODIUM 40 MG/0.4ML ~~LOC~~ SOLN
40.0000 mg | SUBCUTANEOUS | Status: DC
  Administered 2016-09-14 – 2016-09-17 (×4): 40 mg via SUBCUTANEOUS
  Filled 2016-09-14 (×4): qty 0.4

## 2016-09-14 MED ORDER — METHYLPREDNISOLONE SODIUM SUCC 125 MG IJ SOLR
60.0000 mg | Freq: Four times a day (QID) | INTRAMUSCULAR | Status: DC
  Administered 2016-09-14 – 2016-09-16 (×8): 60 mg via INTRAVENOUS
  Filled 2016-09-14 (×8): qty 2

## 2016-09-14 MED ORDER — ALBUTEROL (5 MG/ML) CONTINUOUS INHALATION SOLN
15.0000 mg | INHALATION_SOLUTION | RESPIRATORY_TRACT | Status: DC
  Administered 2016-09-14: 15 mg via RESPIRATORY_TRACT

## 2016-09-14 MED ORDER — ACETAMINOPHEN 325 MG PO TABS: 650.0000 mg | ORAL_TABLET | Freq: Four times a day (QID) | ORAL | Status: DC | PRN

## 2016-09-14 NOTE — ED Notes (Signed)
EMS spoke with NP at hospice who states that patient is a FULL CODE per their records despite an active DNR at the home.

## 2016-09-14 NOTE — H&P (Signed)
History and Physical  [name] [name] ZOX:[id number] DOB: [birth year] DOA: 09/11/2016  Referring physician: Dr. Jacqulyn Bathlong, ED physician PCP: PROVIDER NOT IN SYSTEM  Outpatient Specialists:   Chief Complaint: Respiratory failure  HPI: [name] [name] is a 70 y.o. male with a history of CHF, COPD, hypertension, PTSD. Patient was transitioned to hospice, but recently revoked his hospice and DO NOT RESUSCITATE status. Patient is very somnolent and not able to provide history. History is obtained by the records and emergency department staff. The patient has been having increasing shortness of breath and difficulty breathing over the past 3 weeks, but he has been resistant to come to the emergency department for evaluation. Today, his family members at the sheriff involved and he was transported here for evaluation. In route the patient received 15 mg of albuterol and Atrovent and she was also given Solu-Medrol. He was immediately placed on BiPAP emergency department. Patient unable to comment on provoking or palliating factors, however his symptoms have been worsening.  Emergency Department Course: Initially the patient was unresponsive, however after an hour being on BiPAP, the patient [name] to be more interactive. He has an elevated white count of 18,000. No active disease on chest x-ray today. No fevers. Wheezing noted on exam.  Review of Systems:   Pt unable to provide history  Past Medical History:  Diagnosis Date  . Asthma   . CHF (congestive heart failure) (HCC)   . COPD (chronic obstructive pulmonary disease) (HCC)   . Emphysema   . Hypertension   . Pneumonia   . PTSD (post-traumatic stress disorder)   . Tobacco abuse    Past Surgical History:  Procedure Laterality Date  . HERNIA REPAIR     hiatal  . WISDOM TOOTH EXTRACTION     Social History:  reports that he has been smoking Cigarettes.  He has a 110.00 pack-year smoking history. He has never used smokeless tobacco. He reports  that he drinks about 50.4 oz of alcohol per week . He reports that he does not use drugs. Patient lives at Home  No Known Allergies  Family History  Problem Relation Age of Onset  . Asthma Cousin   . Asthma Cousin   . Diabetes Brother   . Diabetes Mother      Prior to Admission medications   Medication Sig Start Date End Date Taking? Authorizing Provider  acetaminophen (TYLENOL) 500 MG tablet Take 500 mg by mouth 3 (three) times daily as needed (for pain.).    Historical Provider, MD  albuterol (PROVENTIL HFA;VENTOLIN HFA) 108 (90 Base) MCG/ACT inhaler Inhale 2 puffs into the lungs every 6 (six) hours as needed. 05/13/16   Benjiman CoreNathan Pickering, MD  albuterol (PROVENTIL) (5 MG/ML) 0.5% nebulizer solution Take 0.5 mLs (2.5 mg total) by nebulization every 4 (four) hours as needed for wheezing or shortness of breath. 12/26/12   Renae FickleMackenzie Short, MD  amLODipine (NORVASC) 10 MG tablet Take 1 tablet (10 mg total) by mouth daily. 05/13/16   Benjiman CoreNathan Pickering, MD  clindamycin (CLEOCIN) 300 MG capsule Take 1 capsule (300 mg total) by mouth 4 (four) times daily. 09/05/16   Derwood KaplanAnkit Nanavati, MD  fluticasone (FLONASE) 50 MCG/ACT nasal spray Place 2 sprays into both nostrils 2 (two) times daily.    Historical Provider, MD  HYDROcodone-acetaminophen (NORCO/VICODIN) 5-325 MG tablet Take 1 tablet by mouth every 6 (six) hours as needed. 09/05/16   Derwood KaplanAnkit Nanavati, MD  HYDROcodone-acetaminophen (NORCO/VICODIN) 5-325 MG tablet Take 1-2 tablets by mouth every 6 (six) hours as needed  for severe pain. 09/06/16   Gwyneth Sprout, MD  levofloxacin (LEVAQUIN) 750 MG tablet Take 1 tablet (750 mg total) by mouth daily. 08/27/16   Merlene Laughter, DO  Multiple Vitamin (MULTIVITAMIN WITH MINERALS) TABS tablet Take 1 tablet by mouth daily.    Historical Provider, MD  omeprazole (PRILOSEC) 20 MG capsule Take 20 mg by mouth 2 (two) times daily.    Historical Provider, MD  predniSONE (DELTASONE) 20 MG tablet Take 2 tablets (40 mg  total) by mouth daily. 05/14/16   Benjiman Core, MD  sertraline (ZOLOFT) 100 MG tablet Take 1 tablet (100 mg total) by mouth daily. Patient not taking: Reported on 05/13/2016 05/10/15   Earney Navy, NP  sildenafil (VIAGRA) 100 MG tablet Take 50 mg by mouth daily as needed for erectile dysfunction.    Historical Provider, MD  tiotropium (SPIRIVA) 18 MCG inhalation capsule Place 1 capsule (18 mcg total) into inhaler and inhale daily. 05/13/16   Benjiman Core, MD    Physical Exam: BP 124/62   Pulse 101   Temp 97.5 F (36.4 C) (Axillary)   Resp 19   SpO2 95%   General: Elderly Caucasian male. Quite somnolent, although responds to stimuli. No acute cardiopulmonary distress.  HEENT: Normocephalic atraumatic.  Right and left ears normal in appearance.  Pupils equal, round, reactive to light. Extraocular muscles are intact. Sclerae anicteric and noninjected.  Moist mucosal membranes. No mucosal lesions.  Neck: Neck supple without lymphadenopathy. No carotid bruits. No masses palpated.  Cardiovascular: Regular rate with normal S1-S2 sounds. No murmurs, rubs, gallops auscultated. No JVD.  Respiratory: Poor respiratory effort. Patient on BiPAP. Mild wheezing heard over her BiPAP. No rales in the bases. Abdomen: Soft, nontender, nondistended. Active bowel sounds. No masses or hepatosplenomegaly  Skin: No rashes, lesions, or ulcerations.  Dry, warm to touch. 2+ dorsalis pedis and radial pulses. Musculoskeletal: No calf or leg pain. All major joints not erythematous nontender.  No upper or lower joint deformation.  Good ROM.  No contractures  Psychiatric: Intact judgment and insight. Pleasant and cooperative. Neurologic: No focal neurological deficits. Strength is 5/5 and symmetric in upper and lower extremities.  Cranial nerves II through XII are grossly intact.           Labs on Admission: I have personally reviewed following labs and imaging studies  CBC:  Recent Labs Lab  2016-09-20 1026  WBC 18.5*  NEUTROABS 16.6*  HGB 16.1  HCT 51.6  MCV 91.8  PLT 140*   Basic Metabolic Panel:  Recent Labs Lab 09-20-2016 1026  NA 140  K 4.3  CL 95*  CO2 37*  GLUCOSE 111*  BUN 20  CREATININE 0.93  CALCIUM 8.7*   GFR: CrCl cannot be calculated (Unknown ideal weight.). Liver Function Tests:  Recent Labs Lab Sep 20, 2016 1026  AST 18  ALT 24  ALKPHOS 38  BILITOT 1.0  PROT 6.3*  ALBUMIN 3.7    Recent Labs Lab Sep 20, 2016 1026  LIPASE 22   No results for input(s): AMMONIA in the last 168 hours. Coagulation Profile: No results for input(s): INR, PROTIME in the last 168 hours. Cardiac Enzymes: No results for input(s): CKTOTAL, CKMB, CKMBINDEX, TROPONINI in the last 168 hours. BNP (last 3 results) No results for input(s): PROBNP in the last 8760 hours. HbA1C: No results for input(s): HGBA1C in the last 72 hours. CBG: No results for input(s): GLUCAP in the last 168 hours. Lipid Profile: No results for input(s): CHOL, HDL, LDLCALC, TRIG, CHOLHDL, LDLDIRECT in  the last 72 hours. Thyroid Function Tests: No results for input(s): TSH, T4TOTAL, FREET4, T3FREE, THYROIDAB in the last 72 hours. Anemia Panel: No results for input(s): VITAMINB12, FOLATE, FERRITIN, TIBC, IRON, RETICCTPCT in the last 72 hours. Urine analysis:    Component Value Date/Time   COLORURINE AMBER (A) 08/27/2016 0510   APPEARANCEUR CLEAR 08/27/2016 0510   LABSPEC 1.023 08/27/2016 0510   PHURINE 6.0 08/27/2016 0510   GLUCOSEU NEGATIVE 08/27/2016 0510   HGBUR TRACE (A) 08/27/2016 0510   BILIRUBINUR SMALL (A) 08/27/2016 0510   KETONESUR NEGATIVE 08/27/2016 0510   PROTEINUR 30 (A) 08/27/2016 0510   NITRITE NEGATIVE 08/27/2016 0510   LEUKOCYTESUR NEGATIVE 08/27/2016 0510   Sepsis Labs: @LABRCNTIP (procalcitonin:4,lacticidven:4) )No results found for this or any previous visit (from the past 240 hour(s)).   Radiological Exams on Admission: Dg Chest Portable 1 View  Result Date:  09/08/2016 CLINICAL DATA:  Shortness of breath. EXAM: PORTABLE CHEST 1 VIEW COMPARISON:  08/27/2016 FINDINGS: Heart is mildly enlarged. No confluent airspace opacities or effusions. No acute bony abnormality. IMPRESSION: Cardiomegaly.  No active disease. Electronically Signed   By: Charlett NoseKevin  Dover M.D.   On: 09/12/2016 10:36    EKG: Independently reviewed. Sinus tachycardia with PACs. No ST elevation or depression.  Assessment/Plan: Principal Problem:   Acute on chronic respiratory failure with hypercapnia (HCC) Active Problems:   Borderline diabetes   COPD exacerbation (HCC)   Essential hypertension    This patient was discussed with the ED physician, including pertinent vitals, physical exam findings, labs, and imaging.  We also discussed care given by the ED provider.  #1 acute on chronic respiratory failure with hypercapnia and hypoxia  Admit to stepdown  Continue BiPAP  Repeat ABG in the morning  Repeat ABG shows improvement of PCO2 and bicarbonate  Nothing by mouth for now #2 COPD exacerbation DuoNeb's every 6 scheduled with albuterol every 2 when necessary Continue inhaled steroids and LA bronchodilator Solu-Medrol 60 mg IV every 6 hours Mucinex #3 borderline diabetes  With steroids will put the patient's line scale insulin #4 essential hypertension  Continue antihypertensives  DVT prophylaxis: Lovenox Consultants: None Code Status: Full code for now Family Communication: None  Disposition Plan: Pending outcome   Levie HeritageJacob J Jonetta Dagley, DO [facility] Pager [phone]  If 7PM-7AM, please contact night-coverage [email or web address] Password [email or web address]

## 2016-09-14 NOTE — ED Provider Notes (Signed)
Emergency Department Provider Note   I have reviewed the triage vital signs and the nursing notes.   HISTORY  Chief Complaint Respiratory Distress   HPI [name] [name] is a 70 y.o. male with PMH of CHF, COPD, HTN, and tobacco abuse reportedly in hospice care presents to the emergency department with respiratory distress. Per EMS the patient has been having difficulty breathing for the last 3 weeks. Family has been attempting to take him to the emergency department but he has refused. Today they report that the sheriff got involved and he was transported to the emergency department. In route they gave 15 mg of albuterol and additional Atrovent. He was also given Solu-Medrol. He initially seemed to respond to CPAP.   The patient is in respiratory distress and altered. He is unable to provide significant HPI or ROS.    Past Medical History:  Diagnosis Date  . Asthma   . CHF (congestive heart failure) (HCC)   . COPD (chronic obstructive pulmonary disease) (HCC)   . Emphysema   . Hypertension   . Pneumonia   . PTSD (post-traumatic stress disorder)   . Tobacco abuse     Patient Active Problem List   Diagnosis Date Noted  . Acute on chronic respiratory failure with hypercapnia (HCC) 08/27/2016  . CAP (community acquired pneumonia) 08/27/2016  . Sepsis (HCC) 08/27/2016  . Respiratory failure (HCC) 08/27/2016  . Goals of care, counseling/discussion   . Palliative care by specialist   . Homicidal ideation   . Involuntary commitment   . Mood disorder (HCC) 05/10/2015  . Essential hypertension 12/25/2012  . MRSA cellulitis 12/22/2012  . Hyponatremia 12/22/2012  . Axillary abscess 12/22/2012  . COPD exacerbation (HCC) 12/22/2012  . Acute respiratory failure with hypoxia (HCC) 12/01/2012  . Thrombocytopenia (HCC) 12/01/2012  . Sinus tachycardia 12/01/2012  . Borderline diabetes 12/01/2012  . COPD (chronic obstructive pulmonary disease) (HCC)   . PTSD (post-traumatic stress  disorder)   . Tobacco abuse     Past Surgical History:  Procedure Laterality Date  . HERNIA REPAIR     hiatal  . WISDOM TOOTH EXTRACTION        Allergies Patient has no known allergies.  Family History  Problem Relation Age of Onset  . Asthma Cousin   . Asthma Cousin   . Diabetes Brother   . Diabetes Mother     Social History Social History  Substance Use Topics  . Smoking status: Current Every Day Smoker    Packs/day: 2.00    Years: 55.00    Types: Cigarettes  . Smokeless tobacco: Never Used  . Alcohol use 50.4 oz/week    84 Cans of beer per week    Review of Systems  Patient unable to provide significant ROS 2/2 respiratory distress and AMS.   ____________________________________________   PHYSICAL EXAM:  VITAL SIGNS: ED Triage Vitals  Enc Vitals Group     BP 09/01/2016 1000 125/76     Pulse Rate 09/25/2016 1000 120     Resp 09/16/2016 1000 26     Temp 08/29/2016 1000 97.5 F (36.4 C)     Temp Source 09/08/2016 1000 Axillary     SpO2 09/12/2016 0958 98 %   Constitutional: Somnolent. Arouses to pain.  Eyes: Conjunctivae are normal.  Head: Atraumatic. Nose: No congestion/rhinnorhea. Mouth/Throat: Mucous membranes are dry. Neck: No stridor.   Cardiovascular: Tachycardia. Good peripheral circulation. Grossly normal heart sounds.   Respiratory: Increased respiratory effort on BiPAP. Diminished sounds throughout, especially at  the bases.  Gastrointestinal: Soft and nontender. Positive distention.  Musculoskeletal: No lower extremity tenderness nor edema. No gross deformities of extremities. Neurologic:  Somnolent. No gross motor deficits.  Skin:  Skin is warm, dry and intact. No rash noted.  ____________________________________________   LABS (all labs ordered are listed, but only abnormal results are displayed)  Labs Reviewed  COMPREHENSIVE METABOLIC PANEL - Abnormal; Notable for the following:       Result Value   Chloride 95 (*)    CO2 37 (*)     Glucose, Bld 111 (*)    Calcium 8.7 (*)    Total Protein 6.3 (*)    All other components within normal limits  BRAIN NATRIURETIC PEPTIDE - Abnormal; Notable for the following:    B Natriuretic Peptide 108.5 (*)    All other components within normal limits  CBC WITH DIFFERENTIAL/PLATELET - Abnormal; Notable for the following:    WBC 18.5 (*)    RDW 17.2 (*)    Platelets 140 (*)    Neutro Abs 16.6 (*)    All other components within normal limits  GLUCOSE, CAPILLARY - Abnormal; Notable for the following:    Glucose-Capillary 154 (*)    All other components within normal limits  I-STAT ARTERIAL BLOOD GAS, ED - Abnormal; Notable for the following:    pH, Arterial 7.308 (*)    pCO2 arterial 87.7 (*)    pO2, Arterial 207.0 (*)    Bicarbonate 44.0 (*)    Acid-Base Excess 13.0 (*)    All other components within normal limits  I-STAT ARTERIAL BLOOD GAS, ED - Abnormal; Notable for the following:    pH, Arterial 7.333 (*)    pCO2 arterial 77.6 (*)    Bicarbonate 41.2 (*)    Acid-Base Excess 11.0 (*)    All other components within normal limits  CULTURE, BLOOD (ROUTINE X 2)  CULTURE, BLOOD (ROUTINE X 2)  LIPASE, BLOOD  CBC  I-STAT TROPOININ, ED  I-STAT CG4 LACTIC ACID, ED   ____________________________________________  EKG   EKG Interpretation  Date/Time:  Saturday 10/05/16 10:17:27 EST Ventricular Rate:  106 PR Interval:  118 QRS Duration: 78 QT Interval:  308 QTC Calculation: 409 R Axis:   77 Text Interpretation:  Sinus tachycardia with Premature atrial complexes Low voltage QRS Cannot rule out Anteroseptal infarct , age undetermined Abnormal ECG No STEMI.  Confirmed by Tishie Altmann MD, Anastasija Anfinson [phone]) on 2016-10-05 11:05:27 AM       ____________________________________________  RADIOLOGY  Dg Chest Portable 1 View  Result Date: 2016/10/05 CLINICAL DATA:  Shortness of breath. EXAM: PORTABLE CHEST 1 VIEW COMPARISON:  08/27/2016 FINDINGS: Heart is mildly enlarged. No  confluent airspace opacities or effusions. No acute bony abnormality. IMPRESSION: Cardiomegaly.  No active disease. Electronically Signed   By: Charlett Nose M.D.   On: 10-05-16 10:36    ____________________________________________   PROCEDURES  Procedure(s) performed:   Procedures  CRITICAL CARE Performed by: Maia Plan Total critical care time: 45 minutes Critical care time was exclusive of separately billable procedures and treating other patients. Critical care was necessary to treat or prevent imminent or life-threatening deterioration. Critical care was time spent personally by me on the following activities: development of treatment plan with patient and/or surrogate as well as nursing, discussions with consultants, evaluation of patient's response to treatment, examination of patient, obtaining history from patient or surrogate, ordering and performing treatments and interventions, ordering and review of laboratory studies, ordering and review of radiographic  studies, pulse oximetry and re-evaluation of patient's condition.  Alona BeneJoshua Camdyn Beske, MD Emergency Medicine  ____________________________________________   INITIAL IMPRESSION / ASSESSMENT AND PLAN / ED COURSE  Pertinent labs & imaging results that were available during my care of the patient were reviewed by me and considered in my medical decision making (see chart for details).  Patient resents to the emergent department for evaluation of severe respiratory distress on CPAP status post albuterol, Atrovent, steroids en route. The patient has apparently had respiratory distress for the past 3 weeks and was finally convinced to come to the emergency department today. EMS reports that he is on hospice but they did NOT have documentation of his DO NOT RESUSCITATE/DO NOT RESUSCITATE status. Family are apparently in route. Plan to discuss the patient's wishes in regard to intubation as he is somnolent. I do not feel there is a  contraindication to BiPAP at this time. We'll begin continuous albuterol and add magnesium. Suspect this represents an acute on chronic COPD exacerbation.   10:22 AM No family at bedside. Patient does have elevated CO2 but is partially compensated with pH of 7.3 and bicarbonate of 44. Plan to continue BiPAP, albuterol, and magnesium. We'll repeat ABG. EKG reviewed which shows sinus tachycardia with no evidence of acute ischemia.  10:53 AM I called hospice who report that the patient revoked his DNR status and is currently FULL CODE. He remained somnolent on BiPAP but is having no vomiting. His oxygen saturation and blood pressure are within normal limits. I feel at this time he will not require intubation emergently. Continue to follow labs and respiratory status. No family at bedside.   Repeat ABG improving.   Discussed patient's case with hospitalist, Dr. Adrian BlackwaterStinson.  Recommend admission to inpatient, stepdown bed.  I will place holding orders per their request. Patient and family (if present) updated with plan. Care transferred to hospitalist service.  I reviewed all nursing notes, vitals, pertinent old records, EKGs, labs, imaging (as available).  ____________________________________________  FINAL CLINICAL IMPRESSION(S) / ED DIAGNOSES  Final diagnoses:  COPD exacerbation (HCC)     MEDICATIONS GIVEN DURING THIS VISIT:  Medications  albuterol (PROVENTIL, VENTOLIN) (5 MG/ML) 0.5% continuous inhalation solution (  Canceled Entry 12-31-15 1034)  amLODipine (NORVASC) tablet 10 mg (not administered)  fluticasone (FLONASE) 50 MCG/ACT nasal spray 2 spray (not administered)  acetaminophen (TYLENOL) tablet 650 mg (not administered)    Or  acetaminophen (TYLENOL) suppository 650 mg (not administered)  insulin aspart (novoLOG) injection 0-15 Units (3 Units Subcutaneous Given 12-31-15 1700)  enoxaparin (LOVENOX) injection 40 mg (40 mg Subcutaneous Given 12-31-15 1648)  0.9 %  sodium chloride  infusion ( Intravenous New Bag/Given 12-31-15 1650)  albuterol (PROVENTIL) (2.5 MG/3ML) 0.083% nebulizer solution 2.5 mg (not administered)  guaiFENesin (MUCINEX) 12 hr tablet 600 mg (not administered)  methylPREDNISolone sodium succinate (SOLU-MEDROL) 125 mg/2 mL injection 60 mg (60 mg Intravenous Given 12-31-15 1649)  ipratropium-albuterol (DUONEB) 0.5-2.5 (3) MG/3ML nebulizer solution 3 mL (not administered)  magnesium sulfate IVPB 2 g 50 mL (0 g Intravenous Stopped 12-31-15 1147)  LORazepam (ATIVAN) injection 0.5 mg (0.5 mg Intravenous Given 12-31-15 1709)  LORazepam (ATIVAN) 2 MG/ML injection (  Duplicate 12-31-15 1715)     NEW OUTPATIENT MEDICATIONS STARTED DURING THIS VISIT:  None   Note:  This document was prepared using Dragon voice recognition software and may include unintentional dictation errors.  Alona BeneJoshua Tait Balistreri, MD Emergency Medicine   Maia PlanJoshua G Azelia Reiger, MD 12-31-15 [phone]

## 2016-09-14 NOTE — ED Notes (Signed)
Admitting MD at bedside.

## 2016-09-14 NOTE — ED Notes (Signed)
Pt transported to 3s with RN and RT.

## 2016-09-14 NOTE — ED Triage Notes (Signed)
[facility]- pt here from home with resp distress. Pt arrives on CPAP. He had 15mg  of albuterol, 1mg  of atrovent, and 125mg  of solumedrol. Pt has hx of CHF and COPD and is reportedly a hospice pt. Lung sounds are diminished and accessory muscle usage present.

## 2016-09-14 NOTE — Progress Notes (Signed)
Patient transition from BiPap to [id number] with RT agreeing. Patient tolerating well. I will continue to monitor. Family at bedside "[name] [name] " who is POA and wishes to be call before agreeing to discharge home. [name] [name] [phone]

## 2016-09-15 ENCOUNTER — Encounter (HOSPITAL_COMMUNITY): Payer: Self-pay | Admitting: Internal Medicine

## 2016-09-15 DIAGNOSIS — I10 Essential (primary) hypertension: Secondary | ICD-10-CM

## 2016-09-15 DIAGNOSIS — J441 Chronic obstructive pulmonary disease with (acute) exacerbation: Principal | ICD-10-CM

## 2016-09-15 DIAGNOSIS — I998 Other disorder of circulatory system: Secondary | ICD-10-CM

## 2016-09-15 DIAGNOSIS — F4310 Post-traumatic stress disorder, unspecified: Secondary | ICD-10-CM

## 2016-09-15 DIAGNOSIS — R7303 Prediabetes: Secondary | ICD-10-CM

## 2016-09-15 DIAGNOSIS — R000 Tachycardia, unspecified: Secondary | ICD-10-CM

## 2016-09-15 LAB — GLUCOSE, CAPILLARY
GLUCOSE-CAPILLARY: 110 mg/dL — AB (ref 65–99)
GLUCOSE-CAPILLARY: 115 mg/dL — AB (ref 65–99)
GLUCOSE-CAPILLARY: 141 mg/dL — AB (ref 65–99)
GLUCOSE-CAPILLARY: 148 mg/dL — AB (ref 65–99)
Glucose-Capillary: 117 mg/dL — ABNORMAL HIGH (ref 65–99)

## 2016-09-15 LAB — CBC
HEMATOCRIT: 50.1 % (ref 39.0–52.0)
HEMOGLOBIN: 15.2 g/dL (ref 13.0–17.0)
MCH: 27.9 pg (ref 26.0–34.0)
MCHC: 30.3 g/dL (ref 30.0–36.0)
MCV: 91.9 fL (ref 78.0–100.0)
Platelets: 131 10*3/uL — ABNORMAL LOW (ref 150–400)
RBC: 5.45 MIL/uL (ref 4.22–5.81)
RDW: 17.2 % — ABNORMAL HIGH (ref 11.5–15.5)
WBC: 18 10*3/uL — ABNORMAL HIGH (ref 4.0–10.5)

## 2016-09-15 MED ORDER — MORPHINE SULFATE (PF) 4 MG/ML IV SOLN
INTRAVENOUS | Status: AC
  Filled 2016-09-15: qty 1

## 2016-09-15 MED ORDER — ALPRAZOLAM 0.5 MG PO TABS
0.5000 mg | ORAL_TABLET | Freq: Three times a day (TID) | ORAL | Status: DC | PRN
  Administered 2016-09-15 – 2016-09-16 (×4): 0.5 mg via ORAL
  Filled 2016-09-15 (×4): qty 1

## 2016-09-15 MED ORDER — MORPHINE SULFATE 15 MG PO TABS
15.0000 mg | ORAL_TABLET | Freq: Three times a day (TID) | ORAL | Status: DC | PRN
Start: 2016-09-15 — End: 2016-09-17
  Administered 2016-09-15 – 2016-09-16 (×4): 15 mg via ORAL
  Filled 2016-09-15 (×5): qty 1

## 2016-09-15 MED ORDER — MORPHINE SULFATE (PF) 4 MG/ML IV SOLN
4.0000 mg | Freq: Once | INTRAVENOUS | Status: AC
  Administered 2016-09-15: 4 mg via INTRAVENOUS

## 2016-09-15 MED ORDER — LEVOFLOXACIN IN D5W 750 MG/150ML IV SOLN
750.0000 mg | INTRAVENOUS | Status: DC
  Administered 2016-09-15 – 2016-09-16 (×2): 750 mg via INTRAVENOUS
  Filled 2016-09-15 (×2): qty 150

## 2016-09-15 MED ORDER — MORPHINE SULFATE (PF) 2 MG/ML IV SOLN
2.0000 mg | Freq: Once | INTRAVENOUS | Status: AC
  Administered 2016-09-15: 2 mg via INTRAVENOUS
  Filled 2016-09-15: qty 1

## 2016-09-15 NOTE — Progress Notes (Addendum)
[facility] 3S-11, [facility] RN visit note  This is a GIP related and covered condition per Dr.Hertweck hospital admission of October 30, 2015 with an [facility] diagnosis of COPD, acute on chronic respiratory failure and pneumonia.  Visited pt in room 3S-11. He is very anxious, trying to reach nephew on the phone, States he has been trying to reach him since 4 am and cannot. This Clinical research associatewriter dialed the number for him and allowed pt to speak to PrichardBobby. When he speaks with nephew he begs for the nephew to come to the hospital, not go back to sleep as the patient feels he is dying. Pt wants to get out of the bed to use the bathroom and is requesting morphine. Tachycardia rate 100-120, Tachypnea 26-35, Sats 97% on 4l. No family present in room.   Pt unable to describe events leading to hospitalization, however, hospital documentation appears that pt has had increasing respiratory difficulty over the past week and family had sheriff involvement to encourage EMS transfer. Pt unresponsive upon arrival to ED although after 1 hour bipap he was arousable. He took his bipap off late evening/early morning and has remained on 4l. Pt had received 1 dose of Morphine yesterday and is requesting what he takes at home. Discussion with Dr. Darnelle Catalanama who is resuming current at home doses of morphine and ativan.  Reviewed code status with patient who clearly states that he would like to have a couple of rounds of CPR, medications and defibrillation. He does not desire to be intubated or placed on ventilator.   Addendum: Upon leaving unit, pt asked NT to ask me to step into the room. Mr. Ann Makiarrish stated that he just wishes to go home to die, today. I opened the conversation stating that I hear his desires but some of what he is sharing with us regarding CPR, medications and defibrillation conflict with his desire for comfort care only. Pt asked if we could call his nephew and let him decide since he is the HCPOA. I explained further that the role of the HCPOA is to  honor the wishes of the patient. If the patient truly does wish comfort care only, discussion needed to occur with patient and nephew for clear understanding of the patients desire for comfort care and changing of code status to reflect his wishes. RN notified and will call if nephew arrives.   Per chart review:   [name] [name]         YQM:[id number]        DOB: [birth year]                    DOA: 01-14-2016 PCP: Marletta LorBarr, Julie, NP      Brief Narrative:   Chief complaint: Follow-up respiratory failure  [name] Scheuermannhomas Vallo is an 70 y.o. male with a PMH of CHF (no 2-D echo on chart for review), COPD, hypertension, and PTSD who was admitted October 30, 2015 for evaluation of increasing shortness of breath. He was somnolent on admission and placed on BiPAP. He was given albuterol and Solu-Medrol. Chest x-ray was without active disease, and his WBC was 18,000. Of note, the patient is under hospice care, but is a DNI only---wants other resuscitative interventions.  Assessment/Plan:   Principal Problem:   Acute on chronic respiratory failure with hypercapnia (HCC) secondary to COPD exacerbation PCO2 on admission was 87.7. Chest x-ray negative for active disease. Patient admitted to the SDU and placed on BiPAP. Given IV magnesium. Duo nebs every 6 hours, albuterol every 2 hours when necessary,  Solu-Medrol 60 mg every 6 hours and Mucinex ordered on admission. Although chest x-ray was clear, current guidelines recommend empiric antibiotics in patients hospitalized with COPD exacerbation. Will start Levaquin. Obtain sputum cultures if able. Spoke with patient's hospice RN. The patient takes MSIR 15 mg TID for air hunger. Confirmed code status. Will continue the MSIR, although this may put him at risk for respiratory depression.  Apparently the patient has unrealistic expectations, but he does want his symptoms controlled despite risk. He has been weaned to nasal cannula, but I suspect he will need BiPAP again soon given  his respiratory distress. Continues to need SDU level of care.  Active Problems:   Borderline diabetes Currently being managed with moderate scale SSI given treatment with steroids. CBGs 130-154.    Essential hypertension Continue Norvasc.    Ischemic left great toe Has been evaluated in ED x 2 and has refused further evaluation/intervention. Pain management only unless he develops signs of gangrene.    Thrombocytopenia Mild.    H/O PTSD Xanax PRN.   Family Communication/Anticipated D/C date and plan/Code Status   DVT prophylaxis: Lovenox ordered. Code Status: Partial Code.  Family Communication: No family at the bedside. Disposition Plan: [facility] to partial code status. Will see how symptoms evolve.  If he deteriorates further, will get Palliative Care involved to re-address Code status.  [facility] will continue to follow pt daily until discharge.  Thank you. [facility] Hospital Liaison [phone]

## 2016-09-15 NOTE — Progress Notes (Addendum)
Progress Note    [name] [name]  [id number] DOB: 10/17/1946  DOA: 08/30/2016 PCP: Marletta LorBarr, Julie, NP    Brief Narrative:   Chief complaint: Follow-up respiratory failure  [name] [name] is an 70 y.o. male with a PMH of CHF (no 2-D echo on chart for review), COPD, hypertension, and PTSD who was admitted 09/21/2016 for evaluation of increasing shortness of breath. He was somnolent on admission and placed on BiPAP. He was given albuterol and Solu-Medrol. Chest x-ray was without active disease, and his WBC was 18,000. Of note, the patient is under hospice care, but is a DNI only---wants other resuscitative interventions.  Assessment/Plan:   Principal Problem:   Acute on chronic respiratory failure with hypercapnia (HCC) secondary to COPD exacerbation PCO2 on admission was 87.7. Chest x-ray negative for active disease. Patient admitted to the SDU and placed on BiPAP. Given IV magnesium. Duo nebs every 6 hours, albuterol every 2 hours when necessary, Solu-Medrol 60 mg every 6 hours and Mucinex ordered on admission. Although chest x-ray was clear, current guidelines recommend empiric antibiotics in patients hospitalized with COPD exacerbation. Will start Levaquin. Obtain sputum cultures if able. Spoke with patient's hospice RN. The patient takes MSIR 15 mg TID for air hunger. Confirmed code status. Will continue the MSIR, although this may put him at risk for respiratory depression.  Spoke to his hospice nurse at length. Apparently the patient has unrealistic expectations, but he does want his symptoms controlled despite risk. He has been weaned to nasal cannula, but I suspect he will need BiPAP again soon given his respiratory distress. Continues to need SDU level of care.  Active Problems:   Borderline diabetes Currently being managed with moderate scale SSI given treatment with steroids. CBGs 130-154.    Essential hypertension Continue Norvasc.    Ischemic left great toe Has been  evaluated in ED x 2 and has refused further evaluation/intervention. Pain management only unless he develops signs of gangrene.    Thrombocytopenia Mild.    H/O PTSD Xanax PRN.   Family Communication/Anticipated D/C date and plan/Code Status   DVT prophylaxis: Lovenox ordered. Code Status: Partial Code.  Family Communication: No family at the bedside. Nephew updated by telephone (seems to have poor health care literacy). Disposition Plan: [facility] due to partial code status. Will see how symptoms evolve.  If he deteriorates further, will get Palliative Care involved to re-address Code status.   Medical Consultants:    None.   Procedures:    None  Anti-Infectives:    Levaquin 09/15/16--->  Subjective:   The patient reports that he is short of breath and experiencing air hunger. Review of symptoms is positive for cough and left great toe pain and negative for chest pain.  Objective:    Vitals:   09/09/2016 2118 09/15/16 0013 09/15/16 0318 09/15/16 0617  BP:  133/83 (!) 156/73   Pulse: 94 (!) 109 (!) 111 (!) 115  Resp: 20 (!) 25 (!) 26 (!) 30  Temp:  97.5 F (36.4 C) 98.3 F (36.8 C)   TempSrc:  Oral Oral   SpO2: 95% 97% 91% 98%    Intake/Output Summary (Last 24 hours) at 09/15/16 0851 Last data filed at 09/15/16 0319  Gross per 24 hour  Intake               50 ml  Output              200 ml  Net             -  150 ml   There were no vitals filed for this visit.   Tele: Sinus tach at 112 bpm  Exam: General exam: Anxious due to respiratory distress. Respiratory system: Wheezing bilaterally with decreased air movement. Labored breathing with use of accessory muscles & tachypnea. Cardiovascular system: S1 & S2 heard, tachycardic. No JVD,  rubs, gallops or clicks. No murmurs. Gastrointestinal system: Abdomen is nondistended, soft and nontender. No organomegaly or masses felt. Normal bowel sounds heard. Central nervous system: Alert and  oriented. No focal neurological deficits. Extremities: Left great toe cyanotic with other toes on left dusky, palpable pulses. Skin: As above, blotchy skin upper extremities. Psychiatry: Judgement and insight appear impaired. Mood & affect anxious/irritable.   Data Reviewed:   I have personally reviewed following labs and imaging studies:  Labs: Basic Metabolic Panel:  Recent Labs Lab 2016/07/07 1026  NA 140  K 4.3  CL 95*  CO2 37*  GLUCOSE 111*  BUN 20  CREATININE 0.93  CALCIUM 8.7*   GFR CrCl cannot be calculated (Unknown ideal weight.). Liver Function Tests:  Recent Labs Lab 2016/07/07 1026  AST 18  ALT 24  ALKPHOS 38  BILITOT 1.0  PROT 6.3*  ALBUMIN 3.7    Recent Labs Lab 2016/07/07 1026  LIPASE 22   CBC:  Recent Labs Lab 2016/07/07 1026 09/15/16 0229  WBC 18.5* 18.0*  NEUTROABS 16.6*  --   HGB 16.1 15.2  HCT 51.6 50.1  MCV 91.8 91.9  PLT 140* 131*   CBG:  Recent Labs Lab 2016/07/07 1641 2016/07/07 2145 09/15/16 0607  GLUCAP 154* 130* 148*   Sepsis Labs:  Recent Labs Lab 2016/07/07 1026 2016/07/07 1043 09/15/16 0229  WBC 18.5*  --  18.0*  LATICACIDVEN  --  1.18  --     Microbiology No results found for this or any previous visit (from the past 240 hour(s)).  Radiology: Dg Chest Portable 1 View  Result Date: 08-19-2016 CLINICAL DATA:  Shortness of breath. EXAM: PORTABLE CHEST 1 VIEW COMPARISON:  08/27/2016 FINDINGS: Heart is mildly enlarged. No confluent airspace opacities or effusions. No acute bony abnormality. IMPRESSION: Cardiomegaly.  No active disease. Electronically Signed   By: Charlett NoseKevin  Dover M.D.   On: 08-19-2016 10:36    Medications:   . amLODipine  10 mg Oral Daily  . enoxaparin (LOVENOX) injection  40 mg Subcutaneous Q24H  . fluticasone  2 spray Each Nare BID  . guaiFENesin  600 mg Oral BID  . insulin aspart  0-15 Units Subcutaneous Q6H  . ipratropium-albuterol  3 mL Nebulization Q6H  . levofloxacin (LEVAQUIN) IV  750 mg  Intravenous Q24H  . methylPREDNISolone (SOLU-MEDROL) injection  60 mg Intravenous Q6H   Continuous Infusions: . sodium chloride 75 mL/hr at 2016/07/07 1650    Medical decision making is of high complexity and this patient is at high risk of deterioration, therefore this is a level 3 visit.    LOS: 1 day   Maygan Koeller  [facility] Pager [phone]. If unable to reach me by pager, please call my cell phone at [phone].  *Please refer to [email or web address], password [email or web address] to get updated schedule on who will round on this patient, as hospitalists switch teams weekly. If 7PM-7AM, please contact night-coverage at [email or web address], password [email or web address] for any overnight needs.  09/15/2016, 8:51 AM

## 2016-09-15 NOTE — Progress Notes (Signed)
Pt off Bipap at this time tolerating well on 4L [place]. Pt states he just wants to go home. I told him to call if he feels he needs the Bipap. Rt will continue to monitor

## 2016-09-15 NOTE — Progress Notes (Signed)
Chaplain responded to the request of the assigned Nurse to visit with the patient who reports he feels like he is dying and wants to get right with God.  Chaplain inquired why he felt like he was dying he says it a part of life and he wants to make things right with with God. Chaplain had prayer with patient who was relieved to have visit with Chaplain. Chaplain will follow up at a later time his peace of mind. Chaplain Janell QuietAudrey Elenna Spratling [phone]

## 2016-09-16 DIAGNOSIS — J9622 Acute and chronic respiratory failure with hypercapnia: Secondary | ICD-10-CM

## 2016-09-16 LAB — MRSA PCR SCREENING: MRSA BY PCR: NEGATIVE

## 2016-09-16 LAB — BASIC METABOLIC PANEL
ANION GAP: 10 (ref 5–15)
BUN: 25 mg/dL — ABNORMAL HIGH (ref 6–20)
CALCIUM: 8.5 mg/dL — AB (ref 8.9–10.3)
CHLORIDE: 92 mmol/L — AB (ref 101–111)
CO2: 36 mmol/L — AB (ref 22–32)
Creatinine, Ser: 0.75 mg/dL (ref 0.61–1.24)
GFR calc non Af Amer: >60 mL/min (ref >60)
GLUCOSE: 141 mg/dL — AB (ref 65–99)
Potassium: 5 mmol/L (ref 3.5–5.1)
Sodium: 138 mmol/L (ref 135–145)

## 2016-09-16 LAB — GLUCOSE, CAPILLARY
GLUCOSE-CAPILLARY: 140 mg/dL — AB (ref 65–99)
GLUCOSE-CAPILLARY: 191 mg/dL — AB (ref 65–99)
Glucose-Capillary: 143 mg/dL — ABNORMAL HIGH (ref 65–99)

## 2016-09-16 LAB — CBC
HEMATOCRIT: 48.9 % (ref 39.0–52.0)
HEMOGLOBIN: 14.7 g/dL (ref 13.0–17.0)
MCH: 27.9 pg (ref 26.0–34.0)
MCHC: 30.1 g/dL (ref 30.0–36.0)
MCV: 93 fL (ref 78.0–100.0)
Platelets: 116 10*3/uL — ABNORMAL LOW (ref 150–400)
RBC: 5.26 MIL/uL (ref 4.22–5.81)
RDW: 17 % — AB (ref 11.5–15.5)
WBC: 17 10*3/uL — AB (ref 4.0–10.5)

## 2016-09-16 MED ORDER — IPRATROPIUM BROMIDE 0.02 % IN SOLN
0.5000 mg | Freq: Four times a day (QID) | RESPIRATORY_TRACT | Status: DC
  Administered 2016-09-16 – 2016-09-17 (×6): 0.5 mg via RESPIRATORY_TRACT
  Filled 2016-09-16 (×7): qty 2.5

## 2016-09-16 MED ORDER — LORAZEPAM 2 MG/ML IJ SOLN
1.0000 mg | INTRAMUSCULAR | Status: DC | PRN
  Administered 2016-09-16 – 2016-09-17 (×5): 2 mg via INTRAVENOUS
  Filled 2016-09-16 (×6): qty 1

## 2016-09-16 MED ORDER — BUDESONIDE 0.25 MG/2ML IN SUSP
0.2500 mg | Freq: Two times a day (BID) | RESPIRATORY_TRACT | Status: DC
  Administered 2016-09-16 – 2016-09-17 (×4): 0.25 mg via RESPIRATORY_TRACT
  Filled 2016-09-16 (×4): qty 2

## 2016-09-16 MED ORDER — LEVALBUTEROL HCL 0.63 MG/3ML IN NEBU
0.6300 mg | INHALATION_SOLUTION | Freq: Four times a day (QID) | RESPIRATORY_TRACT | Status: DC
  Administered 2016-09-16 – 2016-09-17 (×6): 0.63 mg via RESPIRATORY_TRACT
  Filled 2016-09-16 (×6): qty 3

## 2016-09-16 MED ORDER — MORPHINE SULFATE (PF) 2 MG/ML IV SOLN
1.0000 mg | INTRAVENOUS | Status: DC | PRN
  Administered 2016-09-16 – 2016-09-17 (×7): 2 mg via INTRAVENOUS
  Filled 2016-09-16 (×8): qty 1

## 2016-09-16 MED ORDER — LEVALBUTEROL HCL 0.63 MG/3ML IN NEBU
0.6300 mg | INHALATION_SOLUTION | RESPIRATORY_TRACT | Status: DC | PRN
  Administered 2016-09-16 – 2016-09-18 (×3): 0.63 mg via RESPIRATORY_TRACT
  Filled 2016-09-16 (×3): qty 3

## 2016-09-16 MED ORDER — LEVOFLOXACIN 750 MG PO TABS
750.0000 mg | ORAL_TABLET | Freq: Every day | ORAL | Status: DC
  Administered 2016-09-16: 750 mg via ORAL
  Filled 2016-09-16 (×2): qty 1

## 2016-09-16 MED ORDER — LORAZEPAM 1 MG PO TABS
1.0000 mg | ORAL_TABLET | Freq: Two times a day (BID) | ORAL | Status: DC
  Administered 2016-09-16: 1 mg via ORAL
  Filled 2016-09-16 (×2): qty 1

## 2016-09-16 MED ORDER — METHYLPREDNISOLONE SODIUM SUCC 125 MG IJ SOLR
60.0000 mg | Freq: Three times a day (TID) | INTRAMUSCULAR | Status: DC
  Administered 2016-09-16 – 2016-09-17 (×5): 60 mg via INTRAVENOUS
  Filled 2016-09-16 (×5): qty 2

## 2016-09-16 MED ORDER — IPRATROPIUM-ALBUTEROL 0.5-2.5 (3) MG/3ML IN SOLN: 3.0000 mL | Freq: Four times a day (QID) | RESPIRATORY_TRACT | Status: DC

## 2016-09-16 NOTE — Progress Notes (Signed)
[facility] MSW note: MSW met with patient. Pt is a current [facility] pt. Pt's nephew-Booby Ray lives with pt and is the [place]. 71 year old great nephew also lives the home. MSW explored hospital discharge plans. Pt desires to return home with continued [facility] services. MSW discussed code status with pt. Pt desires to be a DNR at this time. MSW spoke with nephew-Bobby Ray during visit. He wants pt to return home. Mortimer Fries did express that pt does not sleep well at night. Nephew would like an increase in pt's morphine and anti-anxiety medication. Nephew plans to discuss this with Dr. Thereasa Solo. Nephew desires for pt to have a home death. No additional DME needed in the home at this time. Met with Burman Nieves, staff RN and Dr. Thereasa Solo to discuss case. Dr. Thereasa Solo agreed to discuss code status and discharge plans with pt. Please call if questions or concerns arise. Pt will likely need non-emergency EMS to transport back home.   Moody Bruins, MSW [phone]

## 2016-09-16 NOTE — Progress Notes (Signed)
[place] TEAM 1 - Stepdown/ICU TEAM  [name] [name]  ZOX:[id number] DOB: June 22, 1946 DOA: 09/11/2016 PCP: Marletta Lor, NP    Brief Narrative:  70 y.o. male with a Hx of CHF (no 2-D echo on chart for review), COPD, hypertension, and PTSD who was admitted 09/05/2016 for increasing shortness of breath. He was somnolent on admission and placed on BiPAP. He was given albuterol and Solu-Medrol. Chest x-ray was without active disease, and his WBC was 18,000. Of note, the patient is under hospice care, but is a DNI only---wants other resuscitative interventions.  Subjective: At the time of visit the patient is clearly dyspneic and quite anxious.  I spoke with his healthcare power of attorney and also with the hospice nursing team.  The patient tells me that he now wishes to be no CODE BLUE as he would not wish to undergo CPR or cardioversion in the event of cardiac arrest.  I have suggested that it would be in his best interest for [place] to take another day or 2 in the hospital to attempt to better control of his symptoms prior to discharge home and he has agreed to this.  Assessment & Plan:  Acute on chronic respiratory failure with hypercapnia secondary to COPD exacerbation PCO2 on admission was 87.7. Chest x-ray negative for active disease. Patient admitted to the SDU and placed on BiPAP. Given IV magnesium. Duo nebs every 6 hours, albuterol every 2 hours when necessary, Solu-Medrol 60 mg every 6 hours and Mucinex ordered on admission. Although chest x-ray was clear, current guidelines recommend empiric antibiotics in patients hospitalized with COPD exacerbation > Levaquin.  The patient takes MSIR 15 mg TID for air hunger. Confirmed code status. Will continue the MSIR, although this may put him at risk for respiratory depression.  Spoke to his hospice nurse at length. Apparently the patient has unrealistic expectations, but he does want his symptoms controlled despite risk. He has been weaned to nasal  cannula, but I suspect he will need BiPAP again soon given his respiratory distress. Continues to need SDU level of care.  The goal at this time is to accomplish better controlled the patient's anxiety and air hunger prior to discharge home.  At such time that he is more consistently comfortable transitioning back to home with ongoing hospice care would be appropriate.  Borderline diabetes  Essential hypertension Not a significant contributor to his current active issues  Ischemic left great toe Has refused further evaluation/intervention  Thrombocytopenia Mild  PTSD Xanax PRN  DVT prophylaxis: lovenox  Code Status: DNR - NO CODE Family Communication: no family present at time of exam - spoke with healthcare power of attorney Reita Cliche via telephone Disposition Plan: Transfer to palliative care bed off telemetry - titrate medications for air hunger and anxiety - home with hospice when the symptoms are more stable  Consultants:  [facility]  Procedures: none  Antimicrobials:  Levaquin 11/19 >   Objective: Blood pressure (!) 145/70, pulse (!) 104, temperature 97.9 F (36.6 C), temperature source Oral, resp. rate (!) 29, SpO2 99 %.  Intake/Output Summary (Last 24 hours) at 09/16/16 1106 Last data filed at 09/16/16 0911  Gross per 24 hour  Intake              420 ml  Output              500 ml  Net              -80 ml   There were no  vitals filed for this visit.  Examination: General: Anxious and dyspneic Lungs: Wheezing throughout all fields with coarse upper airway sounds transmitted throughout Cardiovascular: Tachycardic but regular without murmur or gallop Abdomen: Nontender, nondistended, soft, bowel sounds positive, no rebound Extremities: No significant cyanosis, clubbing, or edema bilateral lower extremities  CBC:  Recent Labs Lab 09/10/2016 1026 09/15/16 0229 09/16/16 0343  WBC 18.5* 18.0* 17.0*  NEUTROABS 16.6*  --   --   HGB 16.1 15.2 14.7  HCT 51.6 50.1 48.9    MCV 91.8 91.9 93.0  PLT 140* 131* 116*   Basic Metabolic Panel:  Recent Labs Lab 08/29/2016 1026 09/16/16 0343  NA 140 138  K 4.3 5.0  CL 95* 92*  CO2 37* 36*  GLUCOSE 111* 141*  BUN 20 25*  CREATININE 0.93 0.75  CALCIUM 8.7* 8.5*   GFR: CrCl cannot be calculated (Unknown ideal weight.).  Liver Function Tests:  Recent Labs Lab 09/07/2016 1026  AST 18  ALT 24  ALKPHOS 38  BILITOT 1.0  PROT 6.3*  ALBUMIN 3.7    Recent Labs Lab 09/13/2016 1026  LIPASE 22    HbA1C: Hgb A1c MFr Bld  Date/Time Value Ref Range Status  12/22/2012 02:33 AM 4.9 <5.7 % Final    Comment:    (NOTE)                                                                       According to the ADA Clinical Practice Recommendations for 2011, when HbA1c is used as a screening test:  >=6.5%   Diagnostic of Diabetes Mellitus           (if abnormal result is confirmed) 5.7-6.4%   Increased risk of developing Diabetes Mellitus References:Diagnosis and Classification of Diabetes Mellitus,Diabetes Care,2011,34(Suppl 1):S62-S69 and Standards of Medical Care in         Diabetes - 2011,Diabetes Care,2011,34 (Suppl 1):S11-S61.  12/02/2012 03:35 AM 4.8 <5.7 % Final    Comment:    (NOTE)                                                                       According to the ADA Clinical Practice Recommendations for 2011, when HbA1c is used as a screening test:  >=6.5%   Diagnostic of Diabetes Mellitus           (if abnormal result is confirmed) 5.7-6.4%   Increased risk of developing Diabetes Mellitus References:Diagnosis and Classification of Diabetes Mellitus,Diabetes Care,2011,34(Suppl 1):S62-S69 and Standards of Medical Care in         Diabetes - 2011,Diabetes Care,2011,34 (Suppl 1):S11-S61.    CBG:  Recent Labs Lab 09/15/16 1121 09/15/16 1752 09/15/16 2333 09/16/16 0635 09/16/16 0907  GLUCAP 110* 115* 141* 143* 140*    Recent Results (from the past 240 hour(s))  Culture, blood (routine x  2)     Status: None (Preliminary result)   Collection Time: 09/15/2016 10:20 AM  Result Value Ref Range Status   Specimen Description BLOOD  LEFT ANTECUBITAL  Final   Special Requests BOTTLES DRAWN AEROBIC AND ANAEROBIC 10CC  Final   Culture NO GROWTH 1 DAY  Final   Report Status PENDING  Incomplete  Culture, blood (routine x 2)     Status: None (Preliminary result)   Collection Time: 09/19/2016 10:30 AM  Result Value Ref Range Status   Specimen Description BLOOD LEFT ARM  Final   Special Requests BOTTLES DRAWN AEROBIC AND ANAEROBIC 10CC  Final   Culture NO GROWTH 1 DAY  Final   Report Status PENDING  Incomplete     Scheduled Meds: . amLODipine  10 mg Oral Daily  . enoxaparin (LOVENOX) injection  40 mg Subcutaneous Q24H  . fluticasone  2 spray Each Nare BID  . guaiFENesin  600 mg Oral BID  . insulin aspart  0-15 Units Subcutaneous Q6H  . ipratropium-albuterol  3 mL Nebulization Q6H  . levofloxacin (LEVAQUIN) IV  750 mg Intravenous Q24H  . methylPREDNISolone (SOLU-MEDROL) injection  60 mg Intravenous Q6H     LOS: 2 days   Lonia BloodJeffrey T. Jonita Hirota, MD [facility] Office  [phone] Pager - Text Page per Loretha StaplerAmion as per below:  On-Call/Text Page:      [email or web address]      password [email or web address]  If 7PM-7AM, please contact night-coverage [email or web address] Password [email or web address] 09/16/2016, 11:06 AM

## 2016-09-16 NOTE — Progress Notes (Signed)
[facility] 3S-11, [facility] RN visit note  This is a GIP related and covered admission per Dr.Hertweck; hospital admission of 09/03/2016 with an [facility] diagnosis of COPD, acute on chronic respiratory failure and pneumonia.  Visited patient in hospital room.  Patient is snoring, and does not arouse when this writer calls his name twice.  Spoke with with staff RN, Maggie and Dr. Sharon SellerMcClung.  Spoke by telephone with patient's nephew, Wilmon PaliBobby Ray Joynt.  [name] states he would like for either patient's at-home Alprazolam dose or Morphine dose to be increased. He states patient is awake most nights from 1 am-7 am.  [name] resides in the home with patient, and he states he is trying to help, but he is exhausted.  He would like for patient to be "as strong as possible, before he returns home," and if that means patient has to stay in the hospital another night, that is what he wants.    Noted, patient is receiving Levofloxacin 750 mg QD IV.  Patient has received Xanax 0.5 mg po X 1 today for anxiety.  Patient has received Morphine 15 mg X 1 today for pain.   [facility] will continue to follow patient daily until discharge.  Thank you, Kristine GarbeWendy Slingerland, RN [facility] Hospital Liaison [phone]

## 2016-09-16 NOTE — Care Management Note (Addendum)
Case Management Note  Patient Details  Name: [name] [name] MRN: [id number] Date of Birth: 07-25-1946  Subjective/Objective:   Presents with resp failure was put on bipap, is active with [facility] at home.  Patient has all DME at home including oxgen.  Per RN , [name] is the HCPOA ,who lives with patient and states he wishes to speak with MD before patient is dc .  This information relayed to MD.  Per RN, [name] [name] states patient will need ambulance transport to go home. Since he is a Hospice patient [facility] [id number] per Toniann FailWendy with [facility].   Bobby [phone] ,    [phone].  NCM will cont to follow for dc needs.                  Action/Plan:   Expected Discharge Date:                  Expected Discharge Plan:  Home w Hospice Care  In-House Referral:     Discharge planning Services  CM Consult  Post Acute Care Choice:  Resumption of Svcs/PTA Provider Choice offered to:     DME Arranged:    DME Agency:     HH Arranged:    HH Agency:     Status of Service:  Completed, signed off  If discussed at Long Length of Stay Meetings, dates discussed:    Additional Comments:  Leone Havenaylor, Ion Gonnella Clinton, RN 09/16/2016, 10:22 AM

## 2016-09-17 DIAGNOSIS — R0603 Acute respiratory distress: Secondary | ICD-10-CM

## 2016-09-17 DIAGNOSIS — Z515 Encounter for palliative care: Secondary | ICD-10-CM

## 2016-09-17 MED ORDER — LORAZEPAM 2 MG/ML IJ SOLN
2.0000 mg | INTRAMUSCULAR | Status: DC
  Administered 2016-09-17 (×3): 2 mg via INTRAVENOUS
  Filled 2016-09-17 (×3): qty 1

## 2016-09-17 MED ORDER — LORAZEPAM 2 MG/ML IJ SOLN
2.0000 mg | INTRAMUSCULAR | Status: AC
  Administered 2016-09-17: 2 mg via INTRAVENOUS

## 2016-09-17 MED ORDER — SODIUM CHLORIDE 0.9 % IV SOLN
2.0000 mg/h | INTRAVENOUS | Status: DC
  Administered 2016-09-17: 0.5 mg/h via INTRAVENOUS
  Filled 2016-09-17: qty 10
  Filled 2016-09-17: qty 5

## 2016-09-17 MED ORDER — GLYCOPYRROLATE 0.2 MG/ML IJ SOLN
0.2000 mg | INTRAMUSCULAR | Status: DC | PRN
  Administered 2016-09-17 (×2): 0.2 mg via INTRAVENOUS
  Filled 2016-09-17 (×2): qty 1

## 2016-09-17 MED ORDER — MORPHINE BOLUS VIA INFUSION
2.0000 mg | INTRAVENOUS | Status: AC
  Administered 2016-09-17: 2 mg via INTRAVENOUS

## 2016-09-17 MED ORDER — MORPHINE SULFATE (PF) 2 MG/ML IV SOLN
2.0000 mg | INTRAVENOUS | Status: DC | PRN
  Administered 2016-09-17 (×2): 2 mg via INTRAVENOUS

## 2016-09-17 MED ORDER — MORPHINE SULFATE (PF) 2 MG/ML IV SOLN
2.0000 mg | INTRAVENOUS | Status: DC | PRN
  Administered 2016-09-17: 2 mg via INTRAVENOUS

## 2016-09-17 NOTE — Progress Notes (Signed)
[facility] MSW note: Pt is a current hospice patient. Pt is now a DNR. Pt was alone in the room. Pt was non-responsive. Pt appeared comfortable. No grimace on face. MSW spoke with nephew- [name] BackerBobby [name] on phone prior to visit. Nephew was on his way to the hospital to visit pt. Nephew seemed upset and discussed how he just needed to see pt. Nephew reported that he did not want to make any decisions about option of [facility] until he talked with the doctor. Nephew reported that he knew he could not bring pt home for a home death due his own 71 year old son living in the home with pt. Nephew requested this MSW to call later this afternoon to follow up. MSW agreed. Discussed case with Toniann FailWendy, [facility] liaison and Hillary, [facility] Chaplain.   [name] Milesebbie Garner, MSW

## 2016-09-17 NOTE — Progress Notes (Signed)
[facility] 6N-09 [facility] Chaplain Note:  Patient lying in bed unresponsive. Patient mouth breathing, appears comfortable. Chaplain consulted with hospital RN and [facility] Hospital Liaison, Kristine GarbeWendy Slingerland, for updates. Chaplain read notes from [facility] Chaplains. Patient previously shared spiritual concerns with this chaplain, regarding mistakes he has made and not sure if he is right with God. Chaplain provided spiritual presence with affirmation/reassurance of faith and forgiveness. Chaplain facilitated reading of sacred text, sacred song, and prayer. Chaplain offered affirmation of legacy. Chaplain will follow up with pt's nephew, [name].   Please call [facility] with questions and concerns.  Estell HarpinHillary Irusta, MDiv, Chaplain [facility] [phone]

## 2016-09-17 NOTE — Progress Notes (Signed)
Patient extremely labored in breathing this AM, RN and staff could hear patient gasping for air down the hallway.  RN, Respiratory therapist, and Hospice RN advocated for medications and drips to ease his breathing. Palliative started a Morphine drip and made adjustments to other PRN medications.  RN notified Palliative that patient is now very comfortable looking and breathing MUCH easier :-)

## 2016-09-17 NOTE — Progress Notes (Signed)
   09/17/16 0900  Clinical Encounter Type  Visited With Patient  Visit Type Initial  Referral From Care management  Stress Factors  Patient Stress Factors None identified    Chaplain prayed with pt WU:JWJXBJre:health and continued comfort from [place] consult from palliative care, family not present. Will continue to check back when rounding on unit.

## 2016-09-17 NOTE — Progress Notes (Signed)
PROGRESS NOTE    [name] [name]  UJW:[id number] DOB: June 24, 1946 DOA: 09/24/2016 PCP: Marletta LorBarr, Julie, NP   Brief Narrative: [name] [name] is a 70 y.o. malewith a Hx of CHF (no 2-D echo on chart for review), COPD, hypertension, and PTSD who was admitted 09/13/2016 for increasing shortness of breath. He was somnolent on admission and placed on BiPAP. He was given albuterol and Solu-Medrol. Chest x-ray was without active disease, and his WBC was 18,000. Of note, the patient isunder hospice care, but is a DNI only---wants other resuscitative interventions.   Assessment & Plan:   Principal Problem:   Acute on chronic respiratory failure with hypercapnia (HCC) Active Problems:   PTSD (post-traumatic stress disorder)   Sinus tachycardia   Borderline diabetes   COPD exacerbation (HCC)   Essential hypertension   Ischemic toe, left   Acute on chronic respiratory with hypercapnia secondary to COPD exacerbation COPD Patient was initially on Bipap transitioned to [place]. He is currently breathing about 22 per minute with significantly labored breathing. Alert. Discussed with nephew (HCPOA) and Hospice and will focus on comfort only at this time but still treat COPD exacerbation -palliative care consult to help with symptoms/pain management -plan to discharge to residential hospice vs hospital death  Essential hypertension Mildly uncontrolled. No new medication.  Thrombocytopenia Comfort care. No IV sticks  PTSD Xanax prn  Ischemic left great toe No further workup/management   DVT prophylaxis: Lovenox Code Status: DNR Family Communication: Discussed with nephew Youth worker(HCPOA) Disposition Plan: Anticipate death in hospital vs discharge to residential hospice   Consultants:   Hospice  Palliative care  Procedures:   BiPap  Antimicrobials:  Levaquin (11/20>>    Subjective: Patient reports no pain.  Objective: Vitals:   09/16/16 2030 09/17/16 0300 09/17/16 0805 09/17/16 0807    BP:  (!) 145/88    Pulse:  (!) 112    Resp:  (!) 25    Temp:  99 F (37.2 C)    TempSrc:  Oral    SpO2: 98% (!) 86% 99% 99%   No intake or output data in the 24 hours ending 09/17/16 1135 There were no vitals filed for this visit.  Examination:  General exam: Appears in respiratory distress Respiratory system: diffuse wheezing, increased respiratory effort Cardiovascular system: S1 & S2 heard, RRR. No murmurs, rubs, gallops or clicks. Gastrointestinal system: Abdomen is nondistended, soft and nontender. Normal bowel sounds heard. Central nervous system: Alert and oriented. No focal neurological deficits. Extremities: No edema. No calf tenderness Skin: No cyanosis. No rashes Psychiatry: Judgement and insight appear normal. Mood & affect appropriate.     Data Reviewed: I have personally reviewed following labs and imaging studies  CBC:  Recent Labs Lab 09/01/2016 1026 09/15/16 0229 09/16/16 0343  WBC 18.5* 18.0* 17.0*  NEUTROABS 16.6*  --   --   HGB 16.1 15.2 14.7  HCT 51.6 50.1 48.9  MCV 91.8 91.9 93.0  PLT 140* 131* 116*   Basic Metabolic Panel:  Recent Labs Lab 09/11/2016 1026 09/16/16 0343  NA 140 138  K 4.3 5.0  CL 95* 92*  CO2 37* 36*  GLUCOSE 111* 141*  BUN 20 25*  CREATININE 0.93 0.75  CALCIUM 8.7* 8.5*   GFR: CrCl cannot be calculated (Unknown ideal weight.). Liver Function Tests:  Recent Labs Lab 09/17/2016 1026  AST 18  ALT 24  ALKPHOS 38  BILITOT 1.0  PROT 6.3*  ALBUMIN 3.7    Recent Labs Lab 08/28/2016 1026  LIPASE 22  No results for input(s): AMMONIA in the last 168 hours. Coagulation Profile: No results for input(s): INR, PROTIME in the last 168 hours. Cardiac Enzymes: No results for input(s): CKTOTAL, CKMB, CKMBINDEX, TROPONINI in the last 168 hours. BNP (last 3 results) No results for input(s): PROBNP in the last 8760 hours. HbA1C: No results for input(s): HGBA1C in the last 72 hours. CBG:  Recent Labs Lab 09/15/16 1752  09/15/16 2333 09/16/16 0635 09/16/16 0907 09/16/16 1142  GLUCAP 115* 141* 143* 140* 191*   Lipid Profile: No results for input(s): CHOL, HDL, LDLCALC, TRIG, CHOLHDL, LDLDIRECT in the last 72 hours. Thyroid Function Tests: No results for input(s): TSH, T4TOTAL, FREET4, T3FREE, THYROIDAB in the last 72 hours. Anemia Panel: No results for input(s): VITAMINB12, FOLATE, FERRITIN, TIBC, IRON, RETICCTPCT in the last 72 hours. Sepsis Labs:  Recent Labs Lab 22-Oct-2016 1043  LATICACIDVEN 1.18    Recent Results (from the past 240 hour(s))  Culture, blood (routine x 2)     Status: None (Preliminary result)   Collection Time: 22-Oct-2016 10:20 AM  Result Value Ref Range Status   Specimen Description BLOOD LEFT ANTECUBITAL  Final   Special Requests BOTTLES DRAWN AEROBIC AND ANAEROBIC 10CC  Final   Culture NO GROWTH 3 DAYS  Final   Report Status PENDING  Incomplete  Culture, blood (routine x 2)     Status: None (Preliminary result)   Collection Time: 22-Oct-2016 10:30 AM  Result Value Ref Range Status   Specimen Description BLOOD LEFT ARM  Final   Special Requests BOTTLES DRAWN AEROBIC AND ANAEROBIC 10CC  Final   Culture NO GROWTH 3 DAYS  Final   Report Status PENDING  Incomplete  MRSA PCR Screening     Status: None   Collection Time: 09/16/16  3:25 PM  Result Value Ref Range Status   MRSA by PCR NEGATIVE NEGATIVE Final    Comment:        The GeneXpert MRSA Assay (FDA approved for NASAL specimens only), is one component of a comprehensive MRSA colonization surveillance program. It is not intended to diagnose MRSA infection nor to guide or monitor treatment for MRSA infections.          Radiology Studies: No results found.      Scheduled Meds: . budesonide (PULMICORT) nebulizer solution  0.25 mg Nebulization BID  . enoxaparin (LOVENOX) injection  40 mg Subcutaneous Q24H  . fluticasone  2 spray Each Nare BID  . guaiFENesin  600 mg Oral BID  . ipratropium  0.5 mg  Nebulization QID  . levalbuterol  0.63 mg Nebulization QID  . levofloxacin  750 mg Oral Daily  . LORazepam  1 mg Oral BID  . methylPREDNISolone (SOLU-MEDROL) injection  60 mg Intravenous Q8H   Continuous Infusions: . morphine 0.5 mg/hr (09/17/16 1055)     LOS: 3 days     Jacquelin Hawkingalph Nettey [facility] 09/17/2016, 11:35 AM Pager: [phone]  If 7PM-7AM, please contact night-coverage [email or web address] Password [email or web address] 09/17/2016, 11:35 AM

## 2016-09-17 NOTE — Progress Notes (Signed)
Palliative Medicine RN Note: Sofie Rowerephew Bobby came to see pt. [facility] RN Toniann FailWendy at bedside with PMT RN  And Dr Neale BurlyFreeman from PMT. Pt will not wake up. RR at 20 w open-mouth breathing. Nephew reports that pt called him last night and told him that he loves him and that "he's done."   Pt was non-reactive to nephew and medical staff. Reita ClicheBobby states that his goal is pt comfort. If pt does have a surge and wake up, he would like us to call him so he can talk to the pt. Bobby verbalizes understanding that pt is not actively dying, and he states he will not be back to see the pt. He would like to use [employer].   Dr Neale BurlyFreeman discussed plan r/t medication adjustments. He gave orders to increase basal rate, increase bolus frequency and schedule ativan. Will update RN CJ. PMT RN will be available until 1600 today, and PMT on call will be available until 1900 tonight. Outside of these hours, please call primary MD.  Plan f/u by PMT tomorrow am and prn.  Margret ChanceMelanie G. Makaiyah Schweiger, RN, BSN, [facility] 09/17/2016 1:36 PM Cell [phone] 8:00-4:00 Monday-Friday Office [phone]

## 2016-09-17 NOTE — Progress Notes (Signed)
Palliative Medicine RN Note: Saw pt earlier today with Dr Neale BurlyFreeman, who will follow up later today. He initiated morphine infusion and will call nephew.   Over the next 2 hours, rec'd several calls from RN and RT that pt's dyspnea is uncontrolled. Adjusted morphine regimen and ok'd early dose of lorazepam per Dr Neale BurlyFreeman. Dr Neale BurlyFreeman is unable to go to the bedside immediately but will go as soon as he is free (anticipate within an hour). I remain available by phone to access him until then.  Margret ChanceMelanie G. Suzetta Timko, RN, BSN, [facility] 09/17/2016 12:36 PM Cell [phone] 8:00-4:00 Monday-Friday Office [phone]

## 2016-09-17 NOTE — Progress Notes (Signed)
Palliative Medicine RN Note: Rec'd order for PMT. Pt is breathing 24-26 per minute on [place]. He has gotten 10 mg IV and 15 mg PO morphine in past 24 hours. Discussed pt with Dr Neale BurlyFreeman, and morphine continuous infusion was started. Pt appears to be very near EOL  [employer] CorporationMelanie G. Kambria Grima, RN, BSN, [facility] 09/17/2016 10:36 AM Cell [phone] 8:00-4:00 Monday-Friday Office [phone]

## 2016-09-17 NOTE — Consult Note (Signed)
Consultation Note Date: 09/17/2016   Patient Name: [name] [name]  DOB: 1946/09/23  MRN: [id number]  Age / Sex: 70 y.o., male  PCP: Alvester Chou, NP Referring Physician: Mariel Aloe, MD  Reason for Consultation: Disposition, Non pain symptom management, Psychosocial/spiritual support and Terminal Care  HPI/Patient Profile: 70 y.o. male  with past medical history of CHF, COPD, HTN, PTSD admitted on 09/07/2016 with increased somnolence and dyspnea.  He is enrolled with [facility] and plan was to continue current therapies in order to maximize his status prior to returning home with hospice.  He has become increasingly more dyspnic and palliative consulted for symptom management.  Clinical Assessment and Goals of Care: I saw [name] [name] in conjunction with Larina Bras from our team.  Discussed with hospice staff and reviewed chart.    On arrival to room, patient appears to be actively dying and in respiratory distress.  Unresponsive and cannot participate in conversation.  I called and spoke with his nephew/surrogate, [name] [name] on the phone and informed him of condition and plan for initiation of continuous morphine in order to ensure comfort as this is primary goal.  I met again with [name] [name] again in person on his arrival to hospital. He reports that his uncle told him last night that "the end is near and he loves me.  That isnt something we say to each other."  He has already discussed with hospice nurse Abigail Butts) and Threasa Beards from our team and does not want to transition from the hospital due to quick progression he has had in dying process.  NEXT OF KIN: [name] [name] (nephew)   SUMMARY OF RECOMMENDATIONS   - Patient is actively dying.  Continue with comfort care. - Dyspnea: He has had approximately 45 mg oral morphine equivalent in last 24 hours. Initially started with continuous morphine infusion half milligram per hour.  He  received additional bolus dosing and will readjust to 1 mg per hour continuous infusion. Also continue morphine 2 mg every 30 minutes as needed and will plan to continue to titrate continuous infusion based upon his needs to be comfortable. - Anxiety/PTSD: Plan for scheduled Ativan - Axis secretions: Robinul as needed - This will be a terminal admission  Code Status/Advance Care Planning:  DNR  Symptom Management:   As above  Palliative Prophylaxis:   Frequent Pain Assessment  Additional Recommendations (Limitations, Scope, Preferences):  Full Comfort Care  Psycho-social/Spiritual:   Desire for further Chaplaincy support:no  Additional Recommendations: Grief/Bereavement Support  Prognosis:   Hours - Days  Discharge Planning: Anticipated Hospital Death      Primary Diagnoses: Present on Admission: . Acute on chronic respiratory failure with hypercapnia ([place]) . COPD exacerbation ([place]) . Borderline diabetes . Essential hypertension . Sinus tachycardia . PTSD (post-traumatic stress disorder) . Ischemic toe, left   I have reviewed the medical record, interviewed the patient and family, and examined the patient. The following aspects are pertinent.  Past Medical History:  Diagnosis Date  . Asthma   . CHF (congestive heart failure) ([place])   .  COPD (chronic obstructive pulmonary disease) ([place])   . Emphysema   . Hypertension   . MRSA cellulitis 12/22/2012  . Pneumonia   . PTSD (post-traumatic stress disorder)   . Tobacco abuse    Social History   Social History  . Marital status: Single    Spouse name: N/A  . Number of children: N/A  . Years of education: N/A   Social History Main Topics  . Smoking status: Current Every Day Smoker    Packs/day: 2.00    Years: 55.00    Types: Cigarettes  . Smokeless tobacco: Never Used  . Alcohol use 50.4 oz/week    84 Cans of beer per week  . Drug use: No  . Sexual activity: Not Asked   Other Topics Concern  . None    Social History Narrative   [place]  Owns horses.  [facility].     Family History  Problem Relation Age of Onset  . Asthma Cousin   . Asthma Cousin   . Diabetes Brother   . Diabetes Mother    Scheduled Meds: . budesonide (PULMICORT) nebulizer solution  0.25 mg Nebulization BID  . enoxaparin (LOVENOX) injection  40 mg Subcutaneous Q24H  . fluticasone  2 spray Each Nare BID  . guaiFENesin  600 mg Oral BID  . ipratropium  0.5 mg Nebulization QID  . levalbuterol  0.63 mg Nebulization QID  . levofloxacin  750 mg Oral Daily  . LORazepam  2 mg Intravenous Q4H  . methylPREDNISolone (SOLU-MEDROL) injection  60 mg Intravenous Q8H   Continuous Infusions: . morphine 1 mg/hr (09/17/16 1411)   PRN Meds:.acetaminophen **OR** acetaminophen, glycopyrrolate, levalbuterol, menthol-cetylpyridinium, morphine injection Medications Prior to Admission:  Prior to Admission medications   Medication Sig Start Date End Date Taking? Authorizing Provider  acetaminophen (TYLENOL) 500 MG tablet Take 500 mg by mouth 3 (three) times daily as needed (for pain.).   Yes Historical Provider, MD  albuterol (PROVENTIL HFA;VENTOLIN HFA) 108 (90 Base) MCG/ACT inhaler Inhale 2 puffs into the lungs every 6 (six) hours as needed. 05/13/16  Yes Davonna Belling, MD  albuterol (PROVENTIL) (5 MG/ML) 0.5% nebulizer solution Take 0.5 mLs (2.5 mg total) by nebulization every 4 (four) hours as needed for wheezing or shortness of breath. 12/26/12  Yes Janece Canterbury, MD  amLODipine (NORVASC) 10 MG tablet Take 1 tablet (10 mg total) by mouth daily. 05/13/16  Yes Davonna Belling, MD  fluticasone (FLONASE) 50 MCG/ACT nasal spray Place 2 sprays into both nostrils 2 (two) times daily.   Yes Historical Provider, MD  HYDROcodone-acetaminophen (NORCO/VICODIN) 5-325 MG tablet Take 1-2 tablets by mouth every 6 (six) hours as needed for severe pain. 09/06/16  Yes Blanchie Dessert, MD    Multiple Vitamin (MULTIVITAMIN WITH MINERALS) TABS tablet Take 1 tablet by mouth daily.   Yes Historical Provider, MD  omeprazole (PRILOSEC) 20 MG capsule Take 20 mg by mouth 2 (two) times daily.   Yes Historical Provider, MD  predniSONE (DELTASONE) 20 MG tablet Take 2 tablets (40 mg total) by mouth daily. 05/14/16  Yes Davonna Belling, MD  sildenafil (VIAGRA) 100 MG tablet Take 50 mg by mouth daily as needed for erectile dysfunction.   Yes Historical Provider, MD  tiotropium (SPIRIVA) 18 MCG inhalation capsule Place 1 capsule (18 mcg total) into inhaler and inhale daily. 05/13/16  Yes Davonna Belling, MD   No Known Allergies Review of Systems  Unable to perform  ROS   Physical Exam ral: Dyspnic, grey, increased WOB and respiratory distress HEENT: No bruits, no goiter, no JVD Heart: Irregular. No murmur appreciated. Lungs: Poor air movement, scattered rhonchi, excessive upper airway secretions noted Abdomen: Soft, nontender, mild distended, positive bowel sounds.  Ext: No significant edema, faint peripheral pulses Skin: Warm and dry, LE cool Neuro: Unresponsive   Vital Signs: BP (!) 145/88 (BP Location: Right Arm)   Pulse (!) 112   Temp 99 F (37.2 C) (Oral)   Resp (!) 25   SpO2 94%  Pain Assessment: Faces   Pain Score: 8  (Respiratory Distress)   SpO2: SpO2: 94 % O2 Device:SpO2: 94 % O2 Flow Rate: .O2 Flow Rate (L/min): 6 L/min  IO: Intake/output summary:  Intake/Output Summary (Last 24 hours) at 09/17/16 1743 Last data filed at 09/17/16 1702  Gross per 24 hour  Intake             0.59 ml  Output              350 ml  Net          -349.41 ml    LBM: Last BM Date:  (Unknown) Baseline Weight:   Most recent weight:       Palliative Assessment/Data:   Flowsheet Rows   Flowsheet Row Most Recent Value  Intake Tab  Referral Department  Hospitalist  Unit at Time of Referral  Med/Surg Unit  Palliative Care Primary Diagnosis  Pulmonary  Date Notified  09/17/16   Palliative Care Type  Return patient Palliative Care  Reason for referral  Non-pain Symptom  Date of Admission  09/12/2016  Date first seen by Palliative Care  09/17/16  # of days Palliative referral response time  0 Day(s)  # of days IP prior to Palliative referral  3  Clinical Assessment  Palliative Performance Scale Score  10%  Pain Max last 24 hours  Not able to report  Pain Min Last 24 hours  Not able to report  Psychosocial & Spiritual Assessment  Palliative Care Outcomes  Patient/Family meeting held?  Yes  Who was at the meeting?  Nephew, [name] [name]  Palliative Care Outcomes  Improved non-pain symptom therapy, Improved pain interventions, Provided end of life care assistance      Time:  1000-1030; 1200-1240  Time Total: 70 Greater than 50%  of this time was spent counseling and coordinating care related to the above assessment and plan.  Signed by:  , MD   Please contact Palliative Medicine Team phone at [phone] for questions and concerns.  For individual provider: See Amion             

## 2016-09-17 NOTE — Progress Notes (Signed)
OK per Shawna OrleansMelanie, Palliative to insert Foley Cathete for end of life care.  RN will notify MD for order placement.

## 2016-09-17 NOTE — Progress Notes (Signed)
[facility]-09 [facility] RN visit note  This is a GIP related and covered admission per Dr.Hertweck; hospital admission of 09/12/2016 with an [facility] diagnosis of COPD, acute on chronic respiratory failure and pneumonia  Visited patient at the bedside.  There are no family members or visitors present.  Spoke with patient's nephew and decision maker, [name], by telephone.  He states the only goal for patient is to keep him comfortable.  He states at this time, he can't take patient home.  There is an 71 year old in the home.  Talked with Reita ClicheBobby [facility].  He states he understands this.    Noted, patient has received Lorazepam 2 mg IV X 1 today for anxiety. Patient has also received Morphine 2 mg IV today X 2 for air hunger.  [facility] will continue to follow patient daily until discharge.  Thank you, Kristine GarbeWendy Slingerland, RN, BSN [facility] Hospital Liaison [phone]

## 2016-09-19 LAB — CULTURE, BLOOD (ROUTINE X 2)
CULTURE: NO GROWTH
Culture: NO GROWTH

## 2016-09-27 NOTE — Progress Notes (Signed)
 200mg  of IV morphine wasted with Joegena,RN in sink.

## 2016-09-27 NOTE — Discharge Summary (Signed)
[name] [name] GNF:[id number] DOB: 12-May-1946   PCP: Marletta LorBarr, Julie, NP  Admit date: 08/28/2016 Date of Death: 05/24/16 Time of Death: 0145 Notification: Marletta LorBarr, Julie, NP notified of death of 09/25/2016   History of present illness:  [name] [name] is a 70 y.o. male with a history of CHF, COPD, hypertension, PTSD [name] [name] presented with complaint of respiratory failure  HPI written by Dr. Candelaria CelesteJacob Stinson on 09/02/2016  HPI: [name] [name] is a 70 y.o. male with a history of CHF, COPD, hypertension, PTSD. Patient was transitioned to hospice, but recently revoked his hospice and DO NOT RESUSCITATE status. Patient is very somnolent and not able to provide history. History is obtained by the records and emergency department staff. The patient has been having increasing shortness of breath and difficulty breathing over the past 3 weeks, but he has been resistant to come to the emergency department for evaluation. Today, his family members at the sheriff involved and he was transported here for evaluation. In route the patient received 15 mg of albuterol and Atrovent and she was also given Solu-Medrol. He was immediately placed on BiPAP emergency department. Patient unable to comment on provoking or palliating factors, however his symptoms have been worsening.  Emergency Department Course: Initially the patient was unresponsive, however after an hour being on BiPAP, the patient [name] to be more interactive. He has an elevated white count of 18,000. No active disease on chest x-ray today. No fevers. Wheezing noted on exam.  Hospital course Patient was placed on Bipap on admission and treated for a COPD exacerbation with Levaquin, steroids and bronchodilators. Patient improved slightly and was weaned to nasal canula. Plan of care was discussed with his nephew, HCPOA, and the decision was made to go full comfort care with plans to move to residential hospice. Patient, however, continued to decline  respiratory wise and became increasingly somnolent and dyspneic. Palliative care was consulted to help with symptom management. Patient died in hospital on 05/24/16 at 0145.    Final Diagnoses:   Principal Problem:   Acute on chronic respiratory failure with hypercapnia (HCC) Active Problems:   PTSD (post-traumatic stress disorder)   Sinus tachycardia   Borderline diabetes   COPD exacerbation (HCC)   Essential hypertension   Ischemic toe, left    The results of significant diagnostics from this hospitalization (including imaging, microbiology, ancillary and laboratory) are listed below for reference.    Significant Diagnostic Studies: Dg Chest Portable 1 View  Result Date: 09/12/2016 CLINICAL DATA:  Shortness of breath. EXAM: PORTABLE CHEST 1 VIEW COMPARISON:  08/27/2016 FINDINGS: Heart is mildly enlarged. No confluent airspace opacities or effusions. No acute bony abnormality. IMPRESSION: Cardiomegaly.  No active disease. Electronically Signed   By: Charlett NoseKevin  Dover M.D.   On: 09/16/2016 10:36   Dg Chest Portable 1 View  Result Date: 08/27/2016 CLINICAL DATA:  Dyspnea EXAM: PORTABLE CHEST 1 VIEW COMPARISON:  05/13/2016 FINDINGS: Mild interstitial thickening may represent a degree of interstitial edema. No confluent airspace consolidation. No large effusions. The extreme lateral costophrenic angles are excluded from the image. IMPRESSION: Question a degree of interstitial edema. No confluent airspace consolidation or large effusion. Electronically Signed   By: Ellery Plunkaniel R Mitchell M.D.   On: 08/27/2016 01:53   Dg Foot Complete Left  Result Date: 09/06/2016 CLINICAL DATA:  Distal great toe pain for 3 days. Discoloration. No acute injury or history of diabetes. EXAM: LEFT FOOT - COMPLETE 3+ VIEW COMPARISON:  None. FINDINGS: The mineralization and alignment are normal. There is  no evidence of acute fracture or dislocation. There is no evidence of bone destruction. There is evidence of an  old fracture of the distal fibula. Mild midfoot degenerative changes are present. No soft tissue emphysema or foreign body seen. IMPRESSION: No acute findings.  No radiographic evidence of osteomyelitis. Electronically Signed   By: Carey BullocksWilliam  Veazey M.D.   On: 09/06/2016 16:56    Microbiology: Recent Results (from the past 240 hour(s))  MRSA PCR Screening     Status: None   Collection Time: 09/16/16  3:25 PM  Result Value Ref Range Status   MRSA by PCR NEGATIVE NEGATIVE Final    Comment:        The GeneXpert MRSA Assay (FDA approved for NASAL specimens only), is one component of a comprehensive MRSA colonization surveillance program. It is not intended to diagnose MRSA infection nor to guide or monitor treatment for MRSA infections.      Labs: Basic Metabolic Panel: No results for input(s): NA, K, CL, CO2, GLUCOSE, BUN, CREATININE, CALCIUM, MG, PHOS in the last 168 hours. Liver Function Tests: No results for input(s): AST, ALT, ALKPHOS, BILITOT, PROT, ALBUMIN in the last 168 hours. No results for input(s): LIPASE, AMYLASE in the last 168 hours. No results for input(s): AMMONIA in the last 168 hours. CBC: No results for input(s): WBC, NEUTROABS, HGB, HCT, MCV, PLT in the last 168 hours. Cardiac Enzymes: No results for input(s): CKTOTAL, CKMB, CKMBINDEX, TROPONINI in the last 168 hours. D-Dimer No results for input(s): DDIMER in the last 72 hours. BNP: Invalid input(s): POCBNP CBG: No results for input(s): GLUCAP in the last 168 hours. Anemia work up No results for input(s): VITAMINB12, FOLATE, FERRITIN, TIBC, IRON, RETICCTPCT in the last 72 hours. Urinalysis    Component Value Date/Time   COLORURINE AMBER (A) 08/27/2016 0510   APPEARANCEUR CLEAR 08/27/2016 0510   LABSPEC 1.023 08/27/2016 0510   PHURINE 6.0 08/27/2016 0510   GLUCOSEU NEGATIVE 08/27/2016 0510   HGBUR TRACE (A) 08/27/2016 0510   BILIRUBINUR SMALL (A) 08/27/2016 0510   KETONESUR NEGATIVE 08/27/2016 0510    PROTEINUR 30 (A) 08/27/2016 0510   NITRITE NEGATIVE 08/27/2016 0510   LEUKOCYTESUR NEGATIVE 08/27/2016 0510   Sepsis Labs Invalid input(s): PROCALCITONIN,  WBC,  LACTICIDVEN     SIGNED:  Jacquelin Hawkingalph Aviannah Castoro, MD. [facility] Pager [phone]  If 7PM-7AM, please contact night-coverage [email or web address] Password [email or web address] 09/25/2016, 7:44 AM

## 2016-09-27 NOTE — Progress Notes (Signed)
200ml Morphine 250mg /27550ml wasted in sink.

## 2016-09-27 DEATH — deceased

## 2017-11-25 IMAGING — CR DG CHEST 2V
2 series · 2 of 2 positions shown · non-contrast
Comparison: Single-view of the chest 03/29/2013. CT chest
12/23/2012.

CLINICAL DATA: History of COPD. Current smoker. Shortness of breath
and wheezing.

EXAM:
CHEST  2 VIEW

[w chest lat (1 of 2)]
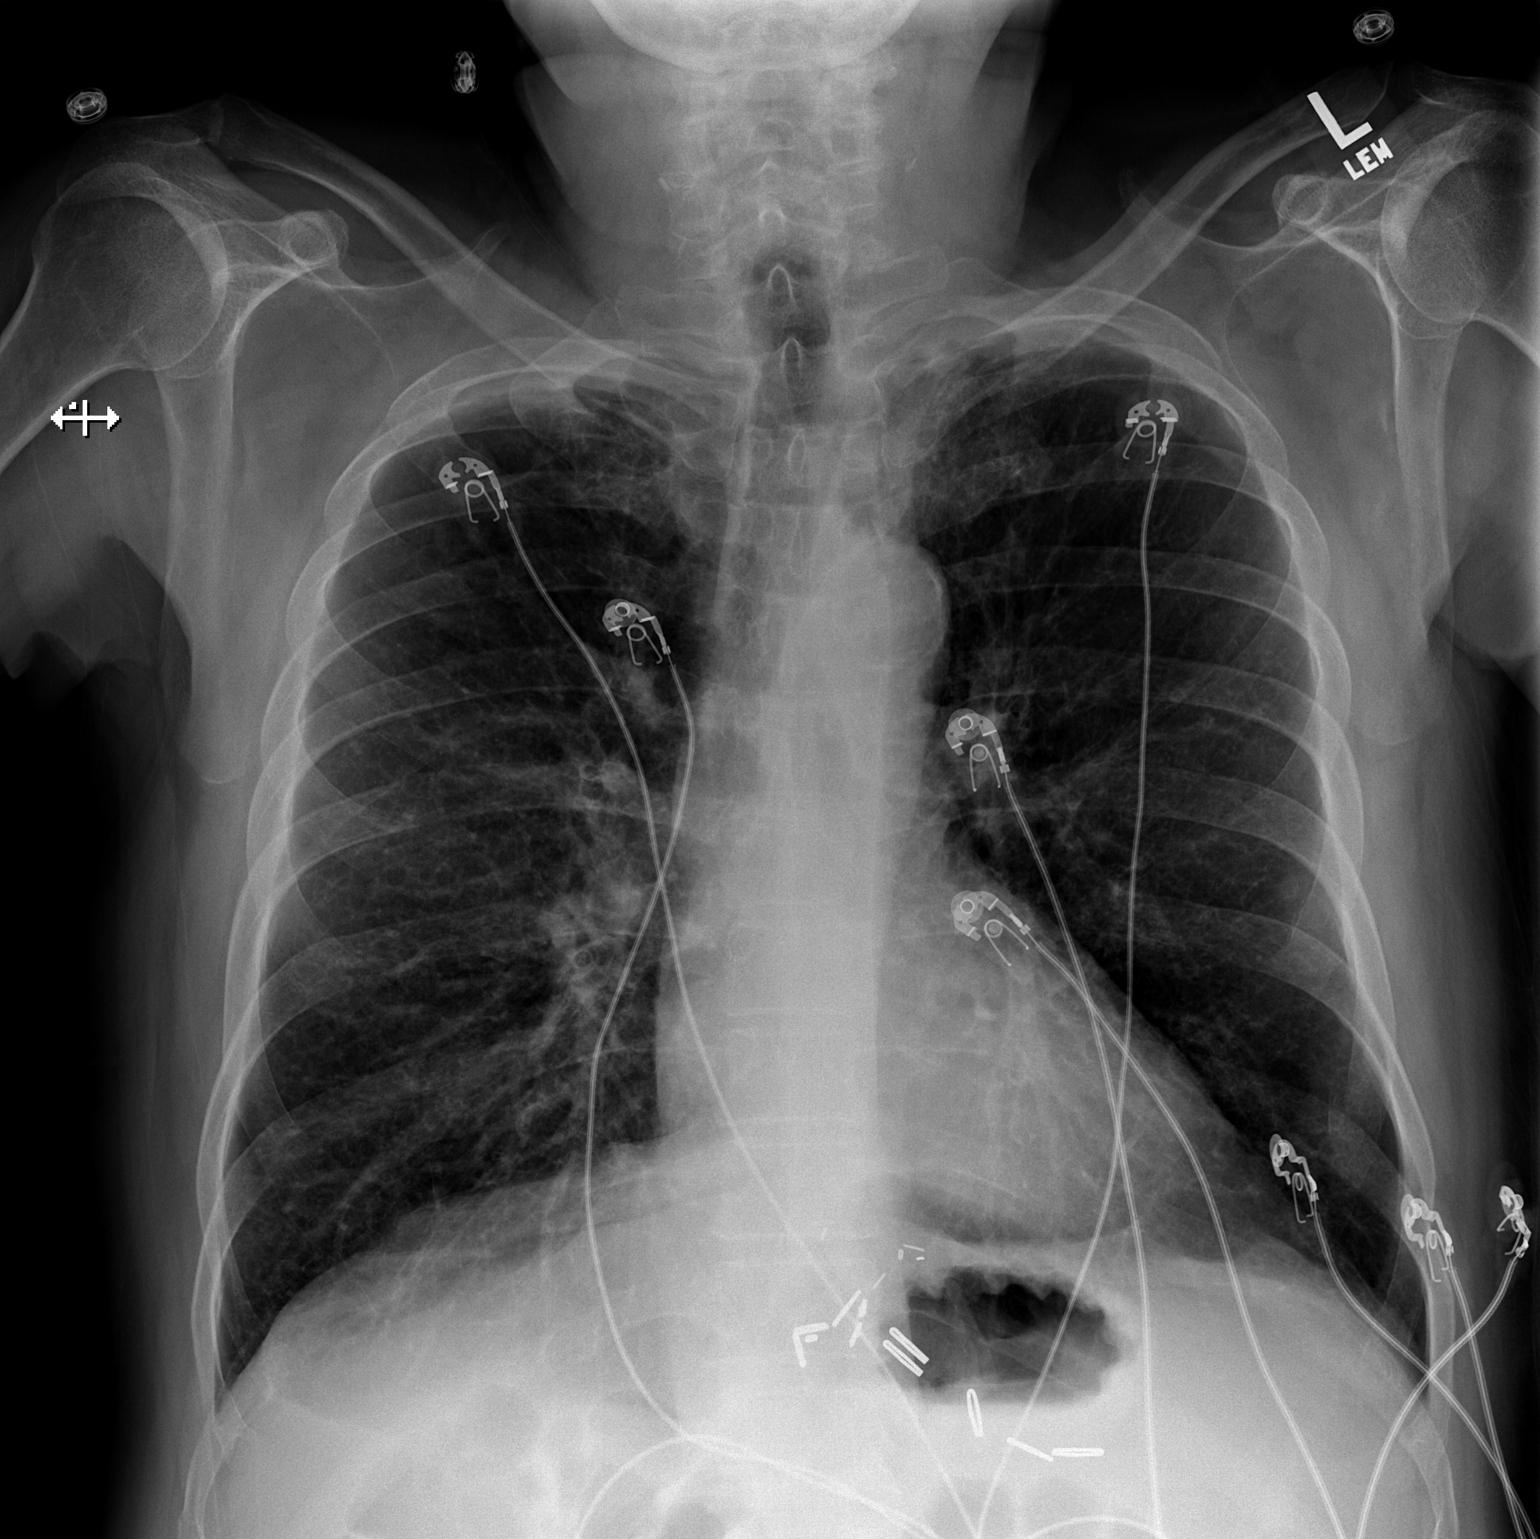

[w chest lat (2 of 2)]
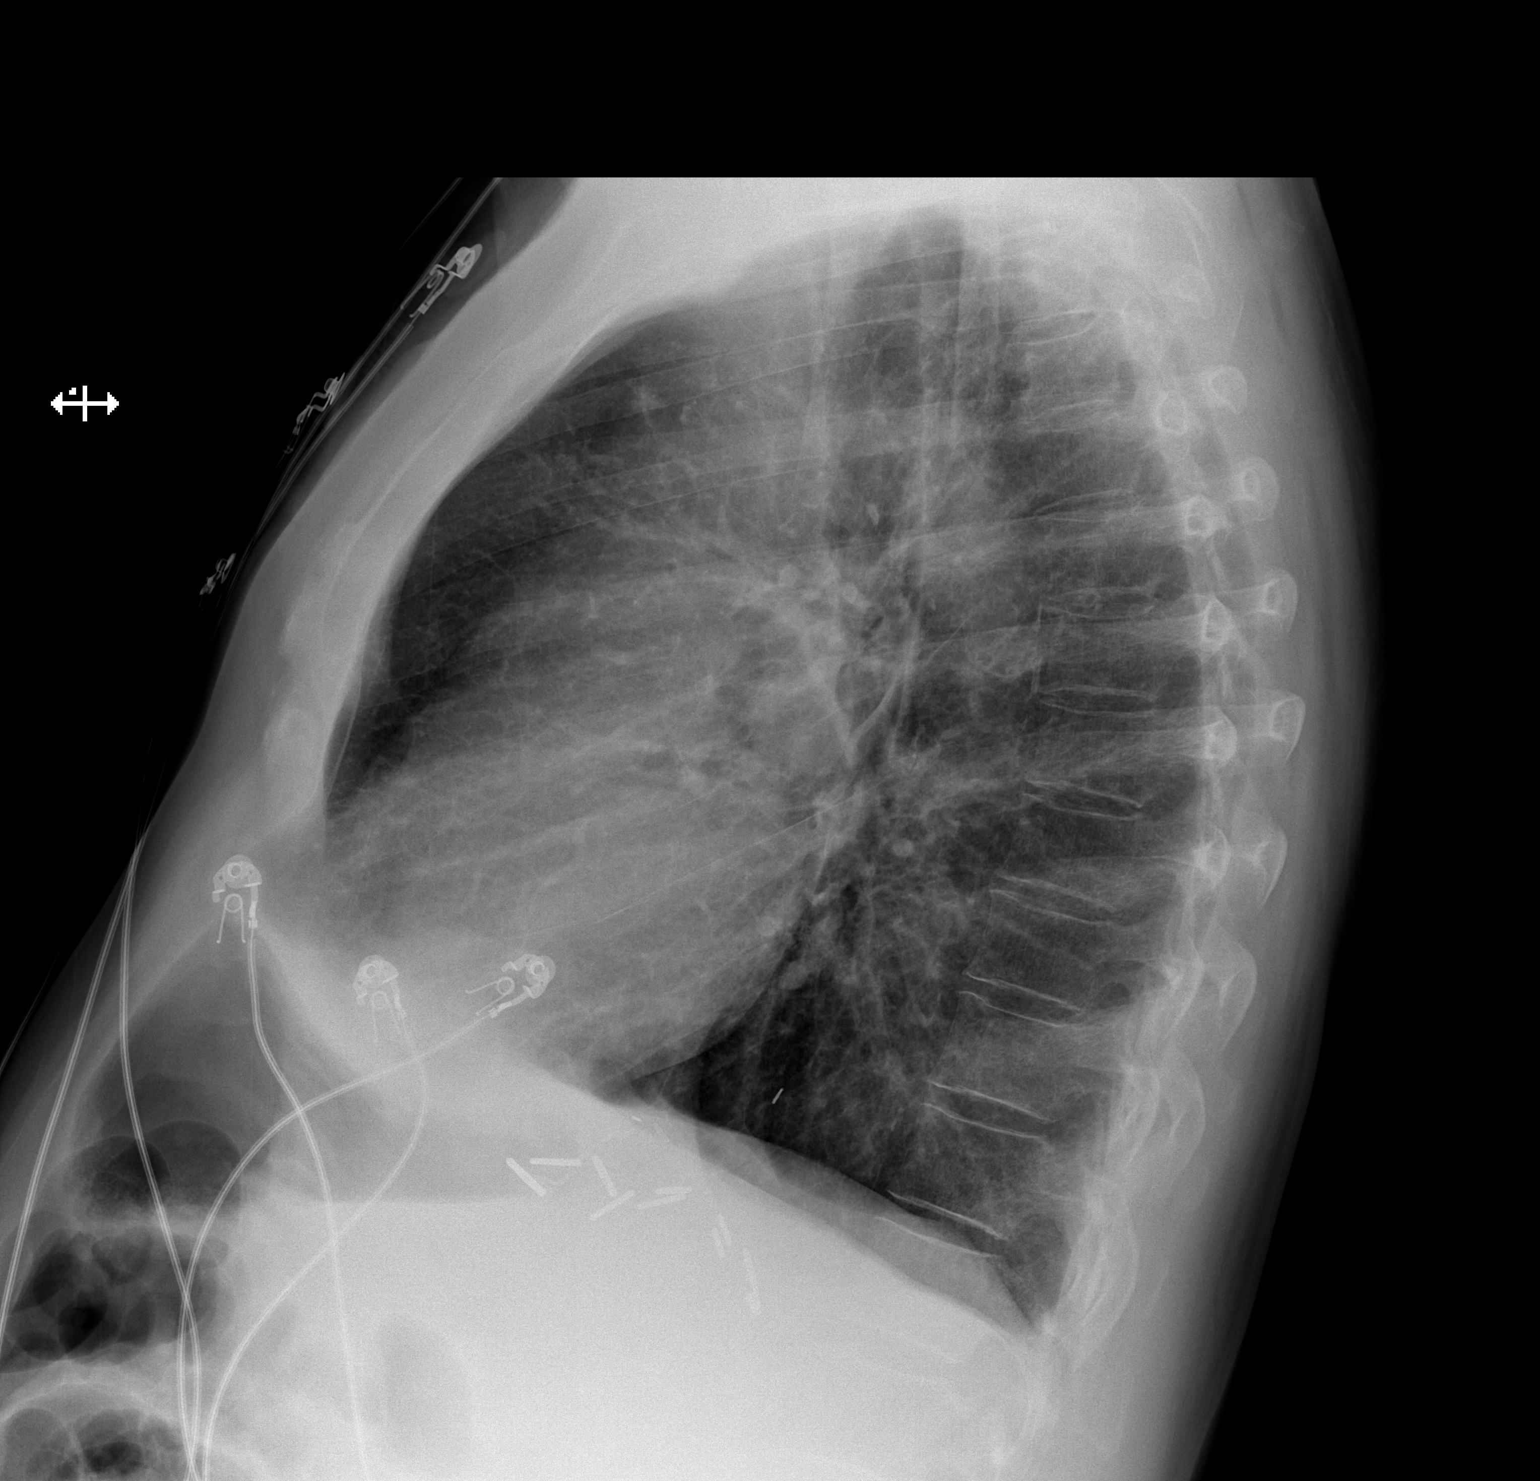

[2 of 2 positions shown; findings below may reference images not displayed]

FINDINGS: The lungs are emphysematous. There is some biapical scarring. No
consolidative process, pneumothorax or effusion. A 0.7 cm nodular
opacity projects in the the left mid lung on the PA view only. Heart
size is normal. No focal bony abnormality. Surgical clips at the
gastroesophageal junction are noted.
IMPRESSION: 0.7 cm nodular opacity projects in the left mid lung. Nonemergent
chest CT with contrast is recommended for further evaluation.

Emphysema.

Atherosclerosis.
